# Patient Record
Sex: Male | Born: 1945 | Race: White | Hispanic: No | Marital: Married | Smoking: Former smoker
Health system: Southern US, Community
[De-identification: ages and names within clinical notes are randomized; demographics above are authoritative.]

## PROBLEM LIST (undated history)

## (undated) DIAGNOSIS — Z923 Personal history of irradiation: Secondary | ICD-10-CM

---

## 2010-10-26 ENCOUNTER — Ambulatory Visit (HOSPITAL_COMMUNITY)
Admission: RE | Admit: 2010-10-26 | Discharge: 2010-10-26 | Disposition: A | Discharge status: Home / Self Care | Payer: Medicare Other | Source: Ambulatory Visit | Attending: Neurological Surgery | Admitting: Neurological Surgery

## 2010-10-26 ENCOUNTER — Other Ambulatory Visit (HOSPITAL_COMMUNITY): Payer: Self-pay | Admitting: Neurological Surgery

## 2010-10-26 ENCOUNTER — Encounter (HOSPITAL_COMMUNITY)
Admission: RE | Admit: 2010-10-26 | Discharge: 2010-10-26 | Disposition: A | Discharge status: Home / Self Care | Payer: Medicare Other | Source: Ambulatory Visit | Attending: Neurological Surgery | Admitting: Neurological Surgery

## 2010-10-26 DIAGNOSIS — Z01810 Encounter for preprocedural cardiovascular examination: Secondary | ICD-10-CM | POA: Insufficient documentation

## 2010-10-26 DIAGNOSIS — Z87891 Personal history of nicotine dependence: Secondary | ICD-10-CM | POA: Insufficient documentation

## 2010-10-26 DIAGNOSIS — Z01818 Encounter for other preprocedural examination: Secondary | ICD-10-CM | POA: Insufficient documentation

## 2010-10-26 DIAGNOSIS — M4802 Spinal stenosis, cervical region: Secondary | ICD-10-CM

## 2010-10-26 DIAGNOSIS — I10 Essential (primary) hypertension: Secondary | ICD-10-CM | POA: Insufficient documentation

## 2010-10-26 DIAGNOSIS — Z01812 Encounter for preprocedural laboratory examination: Secondary | ICD-10-CM | POA: Insufficient documentation

## 2010-10-26 LAB — DIFFERENTIAL
Eosinophils Absolute: 0.2 10*3/uL (ref 0.0–0.7)
Eosinophils Relative: 2 % (ref 0–5)
Lymphs Abs: 2.2 10*3/uL (ref 0.7–4.0)
Monocytes Absolute: 0.5 10*3/uL (ref 0.1–1.0)
Monocytes Relative: 7 % (ref 3–12)

## 2010-10-26 LAB — SURGICAL PCR SCREEN: MRSA, PCR: NEGATIVE

## 2010-10-26 LAB — CBC
HCT: 42.3 % (ref 39.0–52.0)
MCH: 32.3 pg (ref 26.0–34.0)
MCV: 91.2 fL (ref 78.0–100.0)
Platelets: 201 10*3/uL (ref 150–400)
RDW: 13.3 % (ref 11.5–15.5)

## 2010-10-26 LAB — BASIC METABOLIC PANEL
CO2: 28 mEq/L (ref 19–32)
Calcium: 9.9 mg/dL (ref 8.4–10.5)
GFR calc Af Amer: >60 mL/min (ref >60)
Potassium: 4.1 mEq/L (ref 3.5–5.1)
Sodium: 137 mEq/L (ref 135–145)

## 2010-10-29 ENCOUNTER — Inpatient Hospital Stay (HOSPITAL_COMMUNITY)
Admission: RE | Admit: 2010-10-29 | Discharge: 2010-10-30 | DRG: 473 | Disposition: A | Discharge status: Home / Self Care | Payer: Medicare Other | Source: Ambulatory Visit | Attending: Neurological Surgery | Admitting: Neurological Surgery

## 2010-10-29 ENCOUNTER — Inpatient Hospital Stay (HOSPITAL_COMMUNITY): Payer: Medicare Other

## 2010-10-29 DIAGNOSIS — Z87891 Personal history of nicotine dependence: Secondary | ICD-10-CM

## 2010-10-29 DIAGNOSIS — Z79899 Other long term (current) drug therapy: Secondary | ICD-10-CM

## 2010-10-29 DIAGNOSIS — I10 Essential (primary) hypertension: Secondary | ICD-10-CM | POA: Diagnosis present

## 2010-10-29 DIAGNOSIS — M4712 Other spondylosis with myelopathy, cervical region: Principal | ICD-10-CM | POA: Diagnosis present

## 2010-11-06 NOTE — Op Note (Signed)
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  PROCEDURE: 1. Decompressive anterior cervical diskectomy at C5-6. 2. Anterior cervical arthrodesis at C5-6 utilizing an 8-mm PEEK     interbody cage packed with local autograft and Actifuse putty. 3. Anterior cervical plating at C5-6 utilizing Biomet MaxAn plate.  SURGEON:  Tia Alert, MD  ASSISTANT:  Reinaldo Meeker, MD  ANESTHESIA:  General endotracheal.  COMPLICATIONS:  None apparent.  INDICATIONS FOR PROCEDURE:  Michael Knox is a 65 year old gentleman who presented with cervical spinal stenosis.  He was referred by local orthopedic surgeons in [facility].  He had early myelopathy, but no significant radicular pain.  He had an MRI which showed severe spinal stenosis at C5-6 with signal change in the spinal cord with leftward spur at C6-7.  My original plan was to perform a C5-6 and C6-7 ACDF with plating, however, he presents today with no evidence of radiculopathy and no evidence of cord compression at C6-7 with only neural foraminal stenosis on the left at C6-7 and decided that the risks of performing the ACD at C6-7 likely outweighed the central benefits and therefore only decided to decompress the level with severe stenosis and the signal change in the cord at C5-6.  The patient understood the risks, benefits, and expected outcome of the procedure and wished to  proceed.  DESCRIPTION OF PROCEDURE:  The patient was taken to the operating room. After induction of adequate generalized endotracheal anesthesia, he was placed in supine position on the operating room table.  His right anterior cervical region was prepped with DuraPrep and draped in the usual sterile fashion.  Local anesthesia 3 mL was injected and a transverse incision was made to the right of midline and carried down to the platysma which was elevated and then opened with Metzenbaum scissors and then dissected a plane medial to the sternocleidomastoid muscle and internal carotid artery and lateral to the trachea and esophagus to expose C5-6 and C6-7.  Intraoperative fluoroscopy confirmed my level. There were large anterior osteophytes at C5-6.  There was a flowing osteophyte over C6-7.  I was able to remove the anterior osteophytes with Leksell rongeur and placed our shadow line retractors after taking down the longus colli muscle bilaterally at C5-6.  I then incised the disk space and performed the initial diskectomy with pituitary rongeurs and curved curettes.  I then used the high-speed drill to drill the endplates.  I widened the disk space from 2-3 mm to about 8 mm in height, drilling in a rectangular fashion with a high-speed drill.  I saved  the drill shavings in a mucus trap for later arthrodesis.  I drilled down to the level of the posterior spurs in the posterior longitudinal ligament.  The operating microscope was brought into the field.  We opened up the posterior longitudinal ligament with the nerve hook and then removed it from undercutting the bodies of C5 and C6 angling the scope up and down to look up under the vertebral bodies as best we could to decompress the canal.  We marched from pedicle to pedicle along the superior endplate of C6 and then performed bilateral foraminotomies along the C6 nerve roots.  Once we were done, the dura was full and capacious all the way  across.  We could palpate it easily in a circumferential fashion with a nerve hook and we could see the cord pulsatile through the dura.  Therefore, we felt likely he had a good decompression at this level and we measured the interspace to be 8 mm and then used a corresponding PEEK interbody cage, packed it with local autograft and Actifuse putty, and tapped this into position at C5-6.  I then used a Biomet MaxAn plate and placed two 14-mm variable angle screws in the body of C5 and C6 and these were locked in the plate by locking mechanism within the plate.  We then irrigated it with saline solution containing bacitracin, dried all bleeding points and once meticulous hemostasis was achieved I closed the platysma with 3-0 Vicryl, closed the subcuticular and subcutaneous tissues with 3-0 Vicryl, and closed the skin with benzoin and Steri-Strips.  The drapes were removed.  Steri-Strips were applied.  The patient was awakened from general anesthesia and transferred to the recovery room in stable condition.  At the end of the procedure, all sponge, needle, and instrument counts were correct.     Tia Alert, MD     DSJ/MEDQ  D:  10/29/2010  T:  10/30/2010  Job:  [id number]  Electronically Signed by Marikay Alar MD on 11/06/2010 08:11:44 AM

## 2010-12-08 ENCOUNTER — Other Ambulatory Visit: Payer: Self-pay | Admitting: Neurological Surgery

## 2010-12-08 ENCOUNTER — Ambulatory Visit
Admission: RE | Admit: 2010-12-08 | Discharge: 2010-12-08 | Disposition: A | Discharge status: Home / Self Care | Payer: Medicare Other | Source: Ambulatory Visit | Attending: Neurological Surgery | Admitting: Neurological Surgery

## 2010-12-08 DIAGNOSIS — M4712 Other spondylosis with myelopathy, cervical region: Secondary | ICD-10-CM

## 2013-05-24 DIAGNOSIS — K635 Polyp of colon: Secondary | ICD-10-CM | POA: Insufficient documentation

## 2014-04-23 DIAGNOSIS — Z85528 Personal history of other malignant neoplasm of kidney: Secondary | ICD-10-CM | POA: Insufficient documentation

## 2014-04-23 DIAGNOSIS — E559 Vitamin D deficiency, unspecified: Secondary | ICD-10-CM | POA: Insufficient documentation

## 2014-04-23 DIAGNOSIS — E785 Hyperlipidemia, unspecified: Secondary | ICD-10-CM | POA: Insufficient documentation

## 2014-04-23 DIAGNOSIS — R361 Hematospermia: Secondary | ICD-10-CM | POA: Insufficient documentation

## 2014-04-23 DIAGNOSIS — I10 Essential (primary) hypertension: Secondary | ICD-10-CM | POA: Insufficient documentation

## 2014-04-23 DIAGNOSIS — R3911 Hesitancy of micturition: Secondary | ICD-10-CM | POA: Insufficient documentation

## 2014-04-23 DIAGNOSIS — R369 Urethral discharge, unspecified: Secondary | ICD-10-CM | POA: Insufficient documentation

## 2014-04-23 DIAGNOSIS — I739 Peripheral vascular disease, unspecified: Secondary | ICD-10-CM | POA: Insufficient documentation

## 2014-04-23 DIAGNOSIS — R0989 Other specified symptoms and signs involving the circulatory and respiratory systems: Secondary | ICD-10-CM | POA: Insufficient documentation

## 2014-04-23 DIAGNOSIS — R911 Solitary pulmonary nodule: Secondary | ICD-10-CM | POA: Insufficient documentation

## 2014-04-23 DIAGNOSIS — R351 Nocturia: Secondary | ICD-10-CM | POA: Insufficient documentation

## 2014-04-23 DIAGNOSIS — N184 Chronic kidney disease, stage 4 (severe): Secondary | ICD-10-CM | POA: Insufficient documentation

## 2014-04-23 DIAGNOSIS — R3129 Other microscopic hematuria: Secondary | ICD-10-CM | POA: Insufficient documentation

## 2014-04-23 DIAGNOSIS — R749 Abnormal serum enzyme level, unspecified: Secondary | ICD-10-CM | POA: Insufficient documentation

## 2015-09-18 DIAGNOSIS — I6522 Occlusion and stenosis of left carotid artery: Secondary | ICD-10-CM | POA: Insufficient documentation

## 2015-09-18 DIAGNOSIS — N1831 Chronic kidney disease, stage 3a: Secondary | ICD-10-CM | POA: Insufficient documentation

## 2015-09-19 DIAGNOSIS — R7309 Other abnormal glucose: Secondary | ICD-10-CM | POA: Insufficient documentation

## 2016-11-10 DIAGNOSIS — H25043 Posterior subcapsular polar age-related cataract, bilateral: Secondary | ICD-10-CM | POA: Insufficient documentation

## 2016-11-10 DIAGNOSIS — H2513 Age-related nuclear cataract, bilateral: Secondary | ICD-10-CM | POA: Insufficient documentation

## 2016-11-10 DIAGNOSIS — H5203 Hypermetropia, bilateral: Secondary | ICD-10-CM | POA: Insufficient documentation

## 2017-04-21 DIAGNOSIS — I739 Peripheral vascular disease, unspecified: Secondary | ICD-10-CM | POA: Insufficient documentation

## 2019-09-10 DIAGNOSIS — U071 COVID-19: Secondary | ICD-10-CM | POA: Insufficient documentation

## 2019-12-13 DIAGNOSIS — M7989 Other specified soft tissue disorders: Secondary | ICD-10-CM | POA: Insufficient documentation

## 2020-12-26 DIAGNOSIS — I714 Abdominal aortic aneurysm, without rupture, unspecified: Secondary | ICD-10-CM | POA: Insufficient documentation

## 2021-11-02 DIAGNOSIS — C649 Malignant neoplasm of unspecified kidney, except renal pelvis: Secondary | ICD-10-CM | POA: Insufficient documentation

## 2021-12-01 ENCOUNTER — Other Ambulatory Visit: Payer: Self-pay | Admitting: Urology

## 2021-12-01 DIAGNOSIS — C801 Malignant (primary) neoplasm, unspecified: Secondary | ICD-10-CM

## 2021-12-01 DIAGNOSIS — C641 Malignant neoplasm of right kidney, except renal pelvis: Secondary | ICD-10-CM

## 2021-12-02 ENCOUNTER — Ambulatory Visit
Admission: RE | Admit: 2021-12-02 | Discharge: 2021-12-02 | Disposition: A | Discharge status: Home / Self Care | Payer: Self-pay | Source: Ambulatory Visit | Attending: Radiation Oncology | Admitting: Radiation Oncology

## 2021-12-02 ENCOUNTER — Other Ambulatory Visit: Payer: Self-pay | Admitting: Radiation Oncology

## 2021-12-02 DIAGNOSIS — C649 Malignant neoplasm of unspecified kidney, except renal pelvis: Secondary | ICD-10-CM

## 2021-12-15 ENCOUNTER — Ambulatory Visit
Admission: RE | Admit: 2021-12-15 | Discharge: 2021-12-15 | Disposition: A | Discharge status: Home / Self Care | Payer: Medicare Other | Source: Ambulatory Visit | Attending: Urology | Admitting: Urology

## 2021-12-15 DIAGNOSIS — C801 Malignant (primary) neoplasm, unspecified: Secondary | ICD-10-CM

## 2021-12-15 DIAGNOSIS — C641 Malignant neoplasm of right kidney, except renal pelvis: Secondary | ICD-10-CM

## 2021-12-15 MED ORDER — GADOBENATE DIMEGLUMINE 529 MG/ML IV SOLN
18.0000 mL | Freq: Once | INTRAVENOUS | Status: AC | PRN
  Administered 2021-12-15: 18 mL via INTRAVENOUS

## 2021-12-15 NOTE — Progress Notes (Signed)
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 ?Biopsies  ? ?COMPARISON: Ultrasound 12/26/2020, CT 12/16/2014 and previous ?  ?FINDINGS: ?CT CHEST FINDINGS ?  ?Cardiovascular: Heart size normal. No pericardial effusion. Coronary calcifications. 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 ?  ?Mediastinum/Nodes: No mass or adenopathy. ?  ?Lungs/Pleura: No pleural effusion. No neumothorax. 5 mm noncalcified nodule, anterior right upper lobe (3:63), new since 12/16/2014. Stable small calcified subpleural granuloma in the lateral right upper lobe (3:48). Progressive subpleural interstitial thickening peripherally in the lower lobes and right middle lobe. ?  ?Musculoskeletal: Cervical fixation hardware partially visualized.  Anterior vertebral endplate spurring at multiple levels in the mid and lower thoracic spine. ?  ?CT ABDOMEN PELVIS FINDINGS ?  ?Hepatobiliary: No focal liver abnormality is seen. No gallstones, gallbladder wall thickening, or biliary dilatation. ?  ?Pancreas: Unremarkable. No pancreatic ductal dilatation or surrounding inflammatory changes. ?  ?Spleen: Normal in size without focal abnormality. ?  ?Adrenals/Urinary Tract: Adrenal glands unremarkable. No urolithiasis or hydronephrosis. Postop changes in the lower pole right kidney. 2 surgical clips and partially calcified 1 cm soft tissue nodule projecting inferior to the right kidney in the retroperitoneum. 1.1 cm hyperdense lesion laterally in the mid left kidney possibly hemorrhagic cysts but incompletely characterized. Urinary bladder incompletely distended. ?  ?Stomach/Bowel: The stomach is incompletely distended, unremarkable. Small bowel decompressed. The colon is nondilated, unremarkable. ?  ?Vascular/Lymphatic: Bilobed fusiform infrarenal abdominal aortic aneurysm. Proximal moiety 4.2 cm diameter, distal moiety 4.9 cm, tapering to 2.6 cm at the bifurcation. No evidence  of leak. Moderate scattered iliofemoral calcified plaque without aneurysm. Retroaortic left renal vein, an anatomic variant.  No abdominal or pelvic adenopathy. ?  ?Reproductive: Prostate enlargement with central coarse calcifications. ?  ?Other: 7.3 x 6 cm right infrarenal para-aortic mass, IVC displaced anteriorly by the lesion without any intervening fat plane. No ascites. No free air. Left pelvic phleboliths. ?  ?Musculoskeletal: Mild multilevel spondylitic changes in the lumbar spine. No mass or worrisome bone lesion. Mild bilateral hip DJD. ?  ?IMPRESSION: ?1. 7.3 cm right retroperitoneal mass abutting/involving infrarenal IVC. Considerations include metastatic renal cell carcinoma, ?leiomyosarcoma, adenopathy, or other soft tissue neoplasm. This looks approachable posteriorly for percutaneous biopsy if needed. ? ?2. 5 mm right solid pulmonary nodule within the upper lobe, new since 2016. ? ?3. 4.9 cm bilobed infrarenal abdominal aortic aneurysm. Recommend follow-up CT/MR every 6 months and vascular consultation.  ? ?4. 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  Recommend annual imaging followup by CTA or MRA.  ? ?Tobacco/Marijuana/Snuff/ETOH use:  ? ?Past/Anticipated interventions by cardiothoracic surgery, if any:  Right flank open partial nephrectomy by Dr. Estill Dooms and remained diseased free until the fall of 2022. ? ?Past/Anticipated interventions by medical oncology, if any: NA ? ?Signs/Symptoms ?Weight changes, if any: No ?Respiratory complaints, if any:  No ?Hemoptysis, if any: No ?Pain issues, if any:  0/10 ? ?SAFETY ISSUES: ?Prior radiation?  No ?Pacemaker/ICD?  No ?Possible current pregnancy? No ?Is the patient on methotrexate? No ? ?Current Complaints / other details:    ?

## 2021-12-18 ENCOUNTER — Ambulatory Visit
Admission: RE | Admit: 2021-12-18 | Discharge: 2021-12-18 | Disposition: A | Discharge status: Home / Self Care | Payer: Medicare Other | Source: Ambulatory Visit | Attending: Radiation Oncology | Admitting: Radiation Oncology

## 2021-12-18 ENCOUNTER — Other Ambulatory Visit: Payer: Self-pay

## 2021-12-18 VITALS — BP 128/66 | HR 68 | Temp 97.8°F | Resp 18 | Ht 67.0 in | Wt 190.8 lb

## 2021-12-18 DIAGNOSIS — C7989 Secondary malignant neoplasm of other specified sites: Secondary | ICD-10-CM | POA: Diagnosis not present

## 2021-12-18 DIAGNOSIS — Z79899 Other long term (current) drug therapy: Secondary | ICD-10-CM | POA: Diagnosis not present

## 2021-12-18 DIAGNOSIS — C641 Malignant neoplasm of right kidney, except renal pelvis: Secondary | ICD-10-CM | POA: Insufficient documentation

## 2021-12-18 DIAGNOSIS — C649 Malignant neoplasm of unspecified kidney, except renal pelvis: Secondary | ICD-10-CM

## 2021-12-18 DIAGNOSIS — C786 Secondary malignant neoplasm of retroperitoneum and peritoneum: Secondary | ICD-10-CM | POA: Insufficient documentation

## 2021-12-18 DIAGNOSIS — Z7982 Long term (current) use of aspirin: Secondary | ICD-10-CM | POA: Diagnosis not present

## 2021-12-18 DIAGNOSIS — I7143 Infrarenal abdominal aortic aneurysm, without rupture: Secondary | ICD-10-CM | POA: Diagnosis not present

## 2021-12-18 NOTE — Progress Notes (Signed)
?Radiation Oncology         (336) [phone] ?________________________________ ? ?Initial outpatient Consultation ? ?Name: Michael Knox MRN: [id number]  ?Date of Service: 12/18/2021 DOB: 28-Oct-1945 ? ?HU:DJSHF, Eulogio Ditch, Christin Fudge, MD  ? ?REFERRING PHYSICIAN: Raynelle Bring, MD ? ?DIAGNOSIS: 76 year old male with oligometastatic renal cell carcinoma presenting with a 7.3 cm retroperitoneal mass ? ?  ICD-10-CM   ?1. Renal cell carcinoma, unspecified laterality ([place])  C64.9   ?  ?2. Metastatic renal cell carcinoma to intra-abdominal site [facility])  C79.89   ? C64.9   ?  ? ? ?HISTORY OF PRESENT ILLNESS: Michael Knox is a 76 y.o. male seen at the request of Dr. Alinda Money. He was initially diagnosed with renal cell carcinoma in 2013. He underwent right partial nephrectomy on 03/06/12 by Dr. 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  He was followed in close surveillance with repeat CT imaging until 2016, without any evidence of disease recurrence or progression. ? ?He had a CT C/A/P on 08/18/21 for routine surveillance of his AAA, and was found to have a 7.3 cm retroperitoneal mass abutting/involving the infrarenal IVC and a 5 mm right upper lobe pulmonary nodule. He was referred to Dr. Adair Laundry and had a biopsy of the retroperitoneal mass on 10/15/21 with final pathology confirming grade 3, renal cell carcinoma, clear-cell type with abundant necrosis. ? ?He was referred to Dr. Marcy Panning who recommended obtaining a disease staging bone scan which was performed on 10/28/21 and showed no definite evidence of osseous metastatic disease. Staging brain MRI was performed on 11/03/21 and was also negative for metastatic disease. ? ?He self-referred to Dr. Alinda Money for second opinion regarding treatment options on 11/27/2021.  Dr. Alinda Money recommended a repeat chest CT for further evaluation of the RUL pulmonary nodule as well as an MRI abdomen for further assessment  of the proximity of the retroperitoneal lesion to the IVC to determine whether he is a surgical candidate or would be better served with radiotherapy.  He has kindly been referred today to discuss the potential radiation treatment options.. ? ?PREVIOUS RADIATION THERAPY: No ? ?PAST MEDICAL HISTORY: History reviewed. No pertinent past medical history.   ? ?PAST SURGICAL HISTORY:History reviewed. No pertinent surgical history. ? ?FAMILY HISTORY: History reviewed. No pertinent family history. ? ?SOCIAL HISTORY:  ?Social History  ? ?Socioeconomic History  ? Marital status: Married  ?  Spouse name: Not on file  ? Number of children: Not on file  ? Years of education: Not on file  ? Highest education level: Not on file  ?Occupational History  ? Not on file  ?Tobacco Use  ? Smoking status: Not on file  ? Smokeless tobacco: Not on file  ?Substance and Sexual Activity  ? Alcohol use: Not on file  ? Drug use: Not on file  ? Sexual activity: Not on file  ?Other Topics Concern  ? Not on file  ?Social History Narrative  ? Not on file  ? ?Social Determinants of Health  ? ?Financial Resource Strain: Not on file  ?Food Insecurity: Not on file  ?Transportation Needs: Not on file  ?Physical Activity: Not on file  ?Stress: Not on file  ?Social Connections: Not on file  ?Intimate Partner Violence: Not on file  ? ? ?ALLERGIES: Atorvastatin, Pitavastatin, and Simvastatin ? ?MEDICATIONS:  ?Current Outpatient Medications  ?Medication Sig Dispense Refill  ? Cholecalciferol 25 MCG (1000 UT) capsule Take by  mouth.    ? rosuvastatin (CRESTOR) 20 MG tablet TAKE 1 TABLET BY MOUTH EVERY DAY FOR CHOLESTEROL    ? telmisartan (MICARDIS) 80 MG tablet TAKE ONE TABLET BY MOUTH EACH DAY FOR BLOOD PRESSURE    ? amLODipine (NORVASC) 10 MG tablet Take 10 mg by mouth daily.    ? aspirin 81 MG EC tablet Take 1 tablet by mouth daily.    ? famotidine (PEPCID) 20 MG tablet Take by mouth.    ? Garlic 8921 MG CAPS Take by mouth.    ? Omega-3 1000 MG CAPS Take 1  capsule by mouth daily.    ? ?No current facility-administered medications for this encounter.  ? ? ?REVIEW OF SYSTEMS:  On review of systems, the patient reports that he is doing well overall and remains completely asymptomatic.  He denies any chest pain, shortness of breath, cough, fevers, chills, night sweats, unintended weight changes.  He denies any bowel or bladder disturbances, and denies abdominal pain, nausea or vomiting.  He denies any new musculoskeletal or joint aches or pains. A complete review of systems is obtained and is otherwise negative. ? ?  ?PHYSICAL EXAM:  ?Wt Readings from Last 3 Encounters:  ?12/18/21 190 lb 12.8 oz (86.5 kg)  ? ?Temp Readings from Last 3 Encounters:  ?12/18/21 97.8 ?F (36.6 ?C) (Oral)  ? ?BP Readings from Last 3 Encounters:  ?12/18/21 128/66  ? ?Pulse Readings from Last 3 Encounters:  ?12/18/21 68  ? ?Pain Assessment ?Pain Score: 0-No pain/10 ? ?In general this is a well appearing Caucasian male in no acute distress.  He's alert and oriented x4 and appropriate throughout the examination. Cardiopulmonary assessment is negative for acute distress and he exhibits normal effort.  ? ? ?KPS = 100 ? ?100 - Normal; no complaints; no evidence of disease. ?90   - Able to carry on normal activity; minor signs or symptoms of disease. ?80   - Normal activity with effort; some signs or symptoms of disease. ?16   - Cares for self; unable to carry on normal activity or to do active work. ?60   - Requires occasional assistance, but is able to care for most of his personal needs. ?50   - Requires considerable assistance and frequent medical care. ?12   - Disabled; requires special care and assistance. ?30   - Severely disabled; hospital admission is indicated although death not imminent. ?20   - Very sick; hospital admission necessary; active supportive treatment necessary. ?10   - Moribund; fatal processes progressing rapidly. ?0     - Dead ? ?Karnofsky DA, Abelmann WH, Craver LS and  Burchenal [facility] [phone]) The use of the nitrogen mustards in the palliative treatment of carcinoma: with particular reference to bronchogenic carcinoma Cancer 1 634-56 ? ?LABORATORY DATA:  ?Lab Results  ?Component Value Date  ? WBC 6.7 10/26/2010  ? HGB 15.0 10/26/2010  ? HCT 42.3 10/26/2010  ? MCV 91.2 10/26/2010  ? PLT 201 10/26/2010  ? ?Lab Results  ?Component Value Date  ? NA 137 10/26/2010  ? K 4.1 10/26/2010  ? CL 103 10/26/2010  ? CO2 28 10/26/2010  ? ?No results found for: ALT, AST, GGT, ALKPHOS, BILITOT ?  ?RADIOGRAPHY: MR ABDOMEN WWO CONTRAST ? ?Result Date: 12/16/2021 ?CLINICAL DATA:  Follow-up metastatic renal cell carcinoma. Previous partial right nephrectomy. EXAM: MRI ABDOMEN WITHOUT AND WITH CONTRAST TECHNIQUE: Multiplanar multisequence MR imaging of the abdomen was performed both before and after the administration of intravenous contrast. CONTRAST:  69m MULTIHANCE GADOBENATE DIMEGLUMINE 529 MG/ML IV SOLN COMPARISON:  [facility] FINDINGS: Lower chest: No acute findings. Hepatobiliary: No hepatic masses identified. Gallbladder is unremarkable. No evidence of biliary ductal dilatation. Pancreas:  No mass or inflammatory changes. Spleen:  Within normal limits in size and appearance. Adrenals/Urinary Tract: Postop changes again seen from partial right nephrectomy. Multiple tiny benign-appearing renal cysts are again seen bilaterally (no followup imaging is recommended). No evidence of renal mass or hydronephrosis. Stomach/Bowel: Unremarkable. Vascular/Lymphatic: A large heterogeneously enhancing retroperitoneal mass is again seen in the retrocaval region. This measures 7.3 by 5.7 x 4.4 cm, without significant change since prior study. This is consistent with metastatic lymphadenopathy. No other pathologically enlarged lymph nodes are identified. 4.7 cm infrarenal abdominal aortic aneurysm shows no significant change. Other:  None. Musculoskeletal:  No  suspicious bone lesions identified. IMPRESSION: Stable right retroperitoneal mass, consistent with metastatic lymphadenopathy. No new or progressive disease within the abdomen. Stable 4.7 cm infrarenal abdominal

## 2021-12-20 ENCOUNTER — Encounter: Payer: Self-pay | Admitting: Radiation Oncology

## 2021-12-21 NOTE — Progress Notes (Signed)
Patient will be added on to conference for 4/25 to review imaging with radiology and consulting MD's. ?

## 2022-01-04 ENCOUNTER — Other Ambulatory Visit: Payer: Self-pay

## 2022-01-04 ENCOUNTER — Ambulatory Visit
Admission: RE | Admit: 2022-01-04 | Discharge: 2022-01-04 | Disposition: A | Discharge status: Home / Self Care | Payer: Medicare Other | Source: Ambulatory Visit | Attending: Radiation Oncology | Admitting: Radiation Oncology

## 2022-01-04 DIAGNOSIS — C649 Malignant neoplasm of unspecified kidney, except renal pelvis: Secondary | ICD-10-CM | POA: Diagnosis present

## 2022-01-04 DIAGNOSIS — Z510 Encounter for antineoplastic radiation therapy: Secondary | ICD-10-CM | POA: Diagnosis not present

## 2022-01-04 DIAGNOSIS — C7989 Secondary malignant neoplasm of other specified sites: Secondary | ICD-10-CM | POA: Insufficient documentation

## 2022-01-04 NOTE — Progress Notes (Signed)
?  Radiation Oncology         (336) [phone] ?________________________________ ? ?Name: DEVONN GIAMPIETRO MRN: [id number]  ?Date: 01/04/2022  DOB: Sep 19, 1945 ? ? ?Ultra-Hypofractionated Radiation Therapy [place] [facility]) ? ?SIMULATION AND TREATMENT PLANNING NOTE ? ?  ICD-10-CM   ?1. 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 C64.9   ?  ? ? ?DIAGNOSIS:  76 year old male with oligometastatic renal cell carcinoma presenting with a 7.3 cm retroperitoneal mass ? ?NARRATIVE:  The patient was brought to the [place].  Identity was confirmed.  All relevant records and images related to the planned course of therapy were reviewed.  The patient freely provided informed written consent to proceed with treatment after reviewing the details related to the planned course of therapy. The consent form was witnessed and verified by the simulation staff.  Then, the patient was set-up in a stable reproducible  supine position for radiation therapy.  A BodyFix immobilization pillow was fabricated for reproducible positioning.  Surface markings were placed.  The CT images were loaded into the planning software.  The gross target volumes (GTV) and planning target volumes (PTV) were delinieated, and avoidance structures were contoured.  Treatment planning then occurred.  The radiation prescription was entered and confirmed.  A total of two complex treatment devices were fabricated in the form of the BodyFix immobilization pillow and a neck accuform cushion.  I have requested : 3D Simulation  I have requested a DVH of the following structures: targets and all normal structures near the target including kidneys, liver, bowel, stomach, spinal cord and others as noted on the radiation plan to maintain doses in adherence with established limits ? ?SPECIAL TREATMENT PROCEDURE:  The planned course of therapy using radiation constitutes a special treatment procedure. Special care is required in the management of  this patient for the following reasons. High dose per fraction requiring special monitoring for increased toxicities of treatment including daily imaging..  The special nature of the planned course of radiotherapy will require increased physician supervision and oversight to ensure patient's safety with optimal treatment outcomes.    This requires extended time and effort.   ? ?PLAN:  The patient will receive 50 Gy in 10 fraction. ? ?________________________________ ? ?Sheral Apley Tammi Klippel, M.D. ? ?

## 2022-01-15 DIAGNOSIS — Z510 Encounter for antineoplastic radiation therapy: Secondary | ICD-10-CM | POA: Diagnosis not present

## 2022-01-18 ENCOUNTER — Ambulatory Visit
Admission: RE | Admit: 2022-01-18 | Discharge: 2022-01-18 | Disposition: A | Discharge status: Home / Self Care | Payer: Medicare Other | Source: Ambulatory Visit | Attending: Radiation Oncology | Admitting: Radiation Oncology

## 2022-01-18 ENCOUNTER — Other Ambulatory Visit: Payer: Self-pay

## 2022-01-18 DIAGNOSIS — Z510 Encounter for antineoplastic radiation therapy: Secondary | ICD-10-CM | POA: Diagnosis not present

## 2022-01-18 LAB — RAD ONC ARIA SESSION SUMMARY
Course Elapsed Days: 0
Plan Fractions Treated to Date: 1
Plan Prescribed Dose Per Fraction: 5 Gy
Plan Total Fractions Prescribed: 10
Plan Total Prescribed Dose: 50 Gy
Reference Point Dosage Given to Date: 5 Gy
Reference Point Session Dosage Given: 5 Gy
Session Number: 1

## 2022-01-19 ENCOUNTER — Ambulatory Visit
Admission: RE | Admit: 2022-01-19 | Discharge: 2022-01-19 | Disposition: A | Discharge status: Home / Self Care | Payer: Medicare Other | Source: Ambulatory Visit | Attending: Radiation Oncology | Admitting: Radiation Oncology

## 2022-01-19 ENCOUNTER — Other Ambulatory Visit: Payer: Self-pay

## 2022-01-19 DIAGNOSIS — Z510 Encounter for antineoplastic radiation therapy: Secondary | ICD-10-CM | POA: Diagnosis not present

## 2022-01-19 LAB — RAD ONC ARIA SESSION SUMMARY
Course Elapsed Days: 1
Plan Fractions Treated to Date: 2
Plan Prescribed Dose Per Fraction: 5 Gy
Plan Total Fractions Prescribed: 10
Plan Total Prescribed Dose: 50 Gy
Reference Point Dosage Given to Date: 10 Gy
Reference Point Session Dosage Given: 5 Gy
Session Number: 2

## 2022-01-20 ENCOUNTER — Other Ambulatory Visit: Payer: Self-pay

## 2022-01-20 ENCOUNTER — Ambulatory Visit
Admission: RE | Admit: 2022-01-20 | Discharge: 2022-01-20 | Disposition: A | Discharge status: Home / Self Care | Payer: Medicare Other | Source: Ambulatory Visit | Attending: Radiation Oncology | Admitting: Radiation Oncology

## 2022-01-20 DIAGNOSIS — Z510 Encounter for antineoplastic radiation therapy: Secondary | ICD-10-CM | POA: Diagnosis not present

## 2022-01-20 LAB — RAD ONC ARIA SESSION SUMMARY
Course Elapsed Days: 2
Plan Fractions Treated to Date: 3
Plan Prescribed Dose Per Fraction: 5 Gy
Plan Total Fractions Prescribed: 10
Plan Total Prescribed Dose: 50 Gy
Reference Point Dosage Given to Date: 15 Gy
Reference Point Session Dosage Given: 5 Gy
Session Number: 3

## 2022-01-21 ENCOUNTER — Ambulatory Visit
Admission: RE | Admit: 2022-01-21 | Discharge: 2022-01-21 | Disposition: A | Discharge status: Home / Self Care | Payer: Medicare Other | Source: Ambulatory Visit | Attending: Radiation Oncology | Admitting: Radiation Oncology

## 2022-01-21 ENCOUNTER — Other Ambulatory Visit: Payer: Self-pay

## 2022-01-21 DIAGNOSIS — Z510 Encounter for antineoplastic radiation therapy: Secondary | ICD-10-CM | POA: Diagnosis not present

## 2022-01-21 LAB — RAD ONC ARIA SESSION SUMMARY
Course Elapsed Days: 3
Plan Fractions Treated to Date: 4
Plan Prescribed Dose Per Fraction: 5 Gy
Plan Total Fractions Prescribed: 10
Plan Total Prescribed Dose: 50 Gy
Reference Point Dosage Given to Date: 20 Gy
Reference Point Session Dosage Given: 5 Gy
Session Number: 4

## 2022-01-22 ENCOUNTER — Other Ambulatory Visit: Payer: Self-pay

## 2022-01-22 ENCOUNTER — Ambulatory Visit
Admission: RE | Admit: 2022-01-22 | Discharge: 2022-01-22 | Disposition: A | Discharge status: Home / Self Care | Payer: Medicare Other | Source: Ambulatory Visit | Attending: Radiation Oncology | Admitting: Radiation Oncology

## 2022-01-22 DIAGNOSIS — Z510 Encounter for antineoplastic radiation therapy: Secondary | ICD-10-CM | POA: Diagnosis not present

## 2022-01-22 LAB — RAD ONC ARIA SESSION SUMMARY
Course Elapsed Days: 4
Plan Fractions Treated to Date: 5
Plan Prescribed Dose Per Fraction: 5 Gy
Plan Total Fractions Prescribed: 10
Plan Total Prescribed Dose: 50 Gy
Reference Point Dosage Given to Date: 25 Gy
Reference Point Session Dosage Given: 5 Gy
Session Number: 5

## 2022-01-26 ENCOUNTER — Ambulatory Visit
Admission: RE | Admit: 2022-01-26 | Discharge: 2022-01-26 | Disposition: A | Discharge status: Home / Self Care | Payer: Medicare Other | Source: Ambulatory Visit | Attending: Radiation Oncology | Admitting: Radiation Oncology

## 2022-01-26 ENCOUNTER — Other Ambulatory Visit: Payer: Self-pay

## 2022-01-26 DIAGNOSIS — Z510 Encounter for antineoplastic radiation therapy: Secondary | ICD-10-CM | POA: Diagnosis not present

## 2022-01-26 LAB — RAD ONC ARIA SESSION SUMMARY
Course Elapsed Days: 8
Plan Fractions Treated to Date: 6
Plan Prescribed Dose Per Fraction: 5 Gy
Plan Total Fractions Prescribed: 10
Plan Total Prescribed Dose: 50 Gy
Reference Point Dosage Given to Date: 30 Gy
Reference Point Session Dosage Given: 5 Gy
Session Number: 6

## 2022-01-27 ENCOUNTER — Other Ambulatory Visit: Payer: Self-pay

## 2022-01-27 ENCOUNTER — Ambulatory Visit
Admission: RE | Admit: 2022-01-27 | Discharge: 2022-01-27 | Disposition: A | Discharge status: Home / Self Care | Payer: Medicare Other | Source: Ambulatory Visit | Attending: Radiation Oncology | Admitting: Radiation Oncology

## 2022-01-27 DIAGNOSIS — Z510 Encounter for antineoplastic radiation therapy: Secondary | ICD-10-CM | POA: Diagnosis not present

## 2022-01-27 LAB — RAD ONC ARIA SESSION SUMMARY
Course Elapsed Days: 9
Plan Fractions Treated to Date: 7
Plan Prescribed Dose Per Fraction: 5 Gy
Plan Total Fractions Prescribed: 10
Plan Total Prescribed Dose: 50 Gy
Reference Point Dosage Given to Date: 35 Gy
Reference Point Session Dosage Given: 5 Gy
Session Number: 7

## 2022-01-28 ENCOUNTER — Other Ambulatory Visit: Payer: Self-pay

## 2022-01-28 ENCOUNTER — Ambulatory Visit
Admission: RE | Admit: 2022-01-28 | Discharge: 2022-01-28 | Disposition: A | Discharge status: Home / Self Care | Payer: Medicare Other | Source: Ambulatory Visit | Attending: Radiation Oncology | Admitting: Radiation Oncology

## 2022-01-28 DIAGNOSIS — C786 Secondary malignant neoplasm of retroperitoneum and peritoneum: Secondary | ICD-10-CM | POA: Insufficient documentation

## 2022-01-28 DIAGNOSIS — C649 Malignant neoplasm of unspecified kidney, except renal pelvis: Secondary | ICD-10-CM | POA: Insufficient documentation

## 2022-01-28 DIAGNOSIS — Z510 Encounter for antineoplastic radiation therapy: Secondary | ICD-10-CM | POA: Insufficient documentation

## 2022-01-28 LAB — RAD ONC ARIA SESSION SUMMARY
Course Elapsed Days: 10
Plan Fractions Treated to Date: 8
Plan Prescribed Dose Per Fraction: 5 Gy
Plan Total Fractions Prescribed: 10
Plan Total Prescribed Dose: 50 Gy
Reference Point Dosage Given to Date: 40 Gy
Reference Point Session Dosage Given: 5 Gy
Session Number: 8

## 2022-01-29 ENCOUNTER — Other Ambulatory Visit: Payer: Self-pay | Admitting: Radiation Oncology

## 2022-01-29 ENCOUNTER — Other Ambulatory Visit: Payer: Self-pay

## 2022-01-29 ENCOUNTER — Ambulatory Visit
Admission: RE | Admit: 2022-01-29 | Discharge: 2022-01-29 | Disposition: A | Discharge status: Home / Self Care | Payer: Medicare Other | Source: Ambulatory Visit | Attending: Radiation Oncology | Admitting: Radiation Oncology

## 2022-01-29 DIAGNOSIS — Z510 Encounter for antineoplastic radiation therapy: Secondary | ICD-10-CM | POA: Diagnosis not present

## 2022-01-29 LAB — RAD ONC ARIA SESSION SUMMARY
Course Elapsed Days: 11
Plan Fractions Treated to Date: 9
Plan Prescribed Dose Per Fraction: 5 Gy
Plan Total Fractions Prescribed: 10
Plan Total Prescribed Dose: 50 Gy
Reference Point Dosage Given to Date: 45 Gy
Reference Point Session Dosage Given: 5 Gy
Session Number: 9

## 2022-01-29 MED ORDER — PROMETHAZINE HCL 25 MG PO TABS: 25.0000 mg | ORAL_TABLET | Freq: Four times a day (QID) | ORAL | 0 refills | Status: AC | PRN

## 2022-02-01 ENCOUNTER — Other Ambulatory Visit: Payer: Self-pay

## 2022-02-01 ENCOUNTER — Encounter: Payer: Self-pay | Admitting: Urology

## 2022-02-01 ENCOUNTER — Ambulatory Visit
Admission: RE | Admit: 2022-02-01 | Discharge: 2022-02-01 | Disposition: A | Discharge status: Home / Self Care | Payer: Medicare Other | Source: Ambulatory Visit | Attending: Radiation Oncology | Admitting: Radiation Oncology

## 2022-02-01 DIAGNOSIS — Z510 Encounter for antineoplastic radiation therapy: Secondary | ICD-10-CM | POA: Diagnosis not present

## 2022-02-01 DIAGNOSIS — C649 Malignant neoplasm of unspecified kidney, except renal pelvis: Secondary | ICD-10-CM

## 2022-02-01 LAB — RAD ONC ARIA SESSION SUMMARY
Course Elapsed Days: 14
Plan Fractions Treated to Date: 10
Plan Prescribed Dose Per Fraction: 5 Gy
Plan Total Fractions Prescribed: 10
Plan Total Prescribed Dose: 50 Gy
Reference Point Dosage Given to Date: 50 Gy
Reference Point Session Dosage Given: 5 Gy
Session Number: 10

## 2022-03-17 ENCOUNTER — Encounter: Payer: Self-pay | Admitting: Urology

## 2022-03-17 NOTE — Progress Notes (Signed)
Telephone appointment. I verified patient's identity and began nursing interview. Patient reports LT sided-central, upper rib pain 3/10. No other renal related issues reported at this time.  Meaningful use complete. No urinary management medications. I-PSS score of 4-mild. Urology appt- None  Reminded patient of his 10:30am-03/18/22 telephone appointment w/ Ashlyn Bruning PA-C. I left my extension [phone] in case patient needs anything. Patient verbalized understanding.  Patient contact [phone]

## 2022-03-18 ENCOUNTER — Ambulatory Visit
Admission: RE | Admit: 2022-03-18 | Discharge: 2022-03-18 | Disposition: A | Discharge status: Home / Self Care | Payer: Medicare Other | Source: Ambulatory Visit | Attending: Urology | Admitting: Urology

## 2022-03-18 DIAGNOSIS — C649 Malignant neoplasm of unspecified kidney, except renal pelvis: Secondary | ICD-10-CM

## 2022-03-18 NOTE — Progress Notes (Signed)
  Radiation Oncology         (336) [phone] ________________________________  Name: SHAWNMICHAEL PARENTEAU MRN: [id number]  Date: 02/01/2022  DOB: 22-Feb-1946  End of Treatment Note  Diagnosis:   76 year old male with oligometastatic renal cell carcinoma presenting with a 7.3 cm retroperitoneal mass     Indication for treatment:  Curative, Definitive UHRT       Radiation treatment dates:   01/18/22 - 02/01/22  Site/dose:   The retroperitoneal mass was treated to 50 Gy in 10 fractions of 5 Gy  Beams/energy:   The patient was treated using stereotactic body radiotherapy according to a 3D conformal radiotherapy plan.  Volumetric arc fields were employed to deliver 6 MV X-rays.  Image guidance was performed with per fraction cone beam CT prior to treatment under personal MD supervision.  Immobilization was achieved using BodyFix Pillow.  Narrative: The patient tolerated radiation treatment relatively well with some mild nausea but no vomiting or diarrhea.  Plan: The patient has completed radiation treatment. The patient will return to radiation oncology clinic for routine followup in one month. I advised them to call or return sooner if they have any questions or concerns related to their recovery or treatment. ________________________________  Sheral Apley. Tammi Klippel, M.D.

## 2022-03-18 NOTE — Progress Notes (Signed)
Radiation Oncology         [phone]) [phone] ________________________________  Name: Michael Knox MRN: [id number]  Date: 03/18/2022  DOB: June 04, 1946  Post Treatment Note  CC: Waylan Boga, MD  Diagnosis:   76 year old male with oligometastatic renal cell carcinoma presenting with a 7.3 cm retroperitoneal mass     Interval Since Last Radiation:  6.5 weeks  01/18/22 - 02/01/22:  The retroperitoneal mass was treated to 50 Gy in 10 fractions of 5 Gy  Narrative:  I spoke with the patient to conduct his routine scheduled 1 month follow up visit via telephone to spare the patient unnecessary potential exposure in the healthcare setting during the current COVID-19 pandemic.  The patient was notified in advance and gave permission to proceed with this visit format.  He tolerated radiation treatment relatively well with some mild nausea but no vomiting or diarrhea.                              On review of systems, the patient states that he is doing well in general.  He did have a few days of significant nausea, vomiting and diarrhea in the week after completing his treatments but this resolved spontaneously and has not recurred.  He is currently without complaints aside from some mild persistent fatigue but specifically denies abdominal pain, nausea, vomiting, diarrhea or constipation.  Overall, he is pleased with his progress to date and has an upcoming follow-up appointment with his medical oncologist, Dr. Humphrey Rolls on 03/23/2022.  ALLERGIES:  is allergic to atorvastatin, pitavastatin, and simvastatin.  Meds: Current Outpatient Medications  Medication Sig Dispense Refill   amLODipine (NORVASC) 10 MG tablet Take 10 mg by mouth daily.     aspirin 81 MG EC tablet Take 1 tablet by mouth daily.     Cholecalciferol 25 MCG (1000 UT) capsule Take by mouth.     famotidine (PEPCID) 20 MG tablet Take by mouth.     Garlic 1324 MG CAPS Take by mouth.     Omega-3 1000 MG CAPS Take 1 capsule  by mouth daily.     promethazine (PHENERGAN) 25 MG tablet Take 1 tablet (25 mg total) by mouth every 6 (six) hours as needed for nausea or vomiting. 30 tablet 0   rosuvastatin (CRESTOR) 20 MG tablet TAKE 1 TABLET BY MOUTH EVERY DAY FOR CHOLESTEROL     telmisartan (MICARDIS) 80 MG tablet TAKE ONE TABLET BY MOUTH EACH DAY FOR BLOOD PRESSURE     No current facility-administered medications for this encounter.    Physical Findings:  vitals were not taken for this visit.  Pain Assessment Pain Score: 3  (LT upper/ central rib pain.)/10 Unable to assess due to telephone follow-up visit format.  Lab Findings: Lab Results  Component Value Date   WBC 6.7 10/26/2010   HGB 15.0 10/26/2010   HCT 42.3 10/26/2010   MCV 91.2 10/26/2010   PLT 201 10/26/2010     Radiographic Findings: No results found.  Impression/Plan: 74. 76 year old male with oligometastatic renal cell carcinoma presenting with a 7.3 cm retroperitoneal mass. He appears to have recovered well from the effects of his recent ultrahypofractionated radiotherapy and is currently without complaints.  We discussed that while we are happy to continue to participate in his care if clinically indicated, at this point, we would plan to see him back on an as-needed basis.  He will continue in routine follow-up  under the care and direction of Dr. Humphrey Rolls and would also like to continue his care with Dr. Alinda Money.  He has a scheduled follow-up appointment with Dr. Humphrey Rolls on 03/23/2022.  He does not currently have any scheduled follow-up with Dr. Alinda Money but would like to get his opinion regarding monitoring his response to treatment and any recommendations for further treatments before he would consider any additional treatment.  I will share this discussion with Dr. Humphrey Rolls and Dr. Alinda Money and look forward to continuing to follow his progress via correspondence.    Nicholos Johns, PA-C

## 2022-04-29 ENCOUNTER — Telehealth: Payer: Self-pay | Admitting: Oncology

## 2022-04-29 NOTE — Telephone Encounter (Signed)
Scheduled appt per 8/30 referral. Pt is aware of appt date and time. Pt is aware to arrive 15 mins prior to appt time and to bring and updated insurance card. Pt is aware of appt location.   

## 2022-05-12 ENCOUNTER — Other Ambulatory Visit: Payer: Self-pay

## 2022-05-12 ENCOUNTER — Inpatient Hospital Stay: Payer: Medicare Other | Attending: Oncology | Admitting: Oncology

## 2022-05-12 VITALS — BP 154/61 | HR 60 | Temp 98.1°F | Resp 17 | Ht 67.0 in | Wt 187.4 lb

## 2022-05-12 DIAGNOSIS — C786 Secondary malignant neoplasm of retroperitoneum and peritoneum: Secondary | ICD-10-CM | POA: Diagnosis not present

## 2022-05-12 DIAGNOSIS — C649 Malignant neoplasm of unspecified kidney, except renal pelvis: Secondary | ICD-10-CM

## 2022-05-12 DIAGNOSIS — Z87891 Personal history of nicotine dependence: Secondary | ICD-10-CM | POA: Diagnosis not present

## 2022-05-12 DIAGNOSIS — C641 Malignant neoplasm of right kidney, except renal pelvis: Secondary | ICD-10-CM

## 2022-05-12 DIAGNOSIS — Z905 Acquired absence of kidney: Secondary | ICD-10-CM

## 2022-05-12 NOTE — Progress Notes (Signed)
Reason for the request:    Kidney cancer  HPI: I was asked by Tami Ribas, PA-C   to evaluate Michael Knox for the evaluation of kidney cancer.  He is a 76 year old man with history of hypertension and hyperlipidemia was diagnosed with kidney cancer in 2013.  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  He remained on active surveillance without any evidence of relapsed disease. CT scan obtained on August 18, 2021 showed a 7.3 cm retroperitoneal mass involving the IVC and a 5 mm right upper lobe nodule.  Biopsy obtained in February 2023 showed recurrent disease including clear-cell renal cell carcinoma.  He was evaluated subsequently by Dr. Tammi Klippel and underwent radiation therapy for a total of 10 fractions for a total of 50 Gray completed in June 2023.  He has subsequently was on active surveillance and imaging studies, noted on March 25, 2022 with a PET scan.  The PET scan showed a right retroperitoneal nodal recurrence of renal carcinoma that has not significantly changed in size from recent prior study demonstrated low-level activity.  No evidence of metastatic disease otherwise.  She is followed by Dr. Humphrey Rolls and recommended that systemic therapy for him which the patient has declined.  Clinically, he reports no major changes in his health.  He denies any nausea, vomiting or abdominal pain.  He denies any pelvic pain or discomfort.  He denies any hematuria or dysuria.  She does not report any headaches, blurry vision, syncope or seizures. Does not report any fevers, chills or sweats.  Does not report any cough, wheezing or hemoptysis.  Does not report any chest pain, palpitation, orthopnea or leg edema.  Does not report any nausea, vomiting or abdominal pain.  Does not report any constipation or diarrhea.  Does not report any skeletal complaints.    Does not report frequency, urgency or hematuria.  Does not report any skin rashes or lesions. Does not  report any heat or cold intolerance.  Does not report any lymphadenopathy or petechiae.  Does not report any anxiety or depression.  Remaining review of systems is negative.   Past medical history significant for hypertension and hyperlipidemia.  Current Outpatient Medications:    amLODipine (NORVASC) 10 MG tablet, Take 10 mg by mouth daily., Disp: , Rfl:    aspirin 81 MG EC tablet, Take 1 tablet by mouth daily., Disp: , Rfl:    Cholecalciferol 25 MCG (1000 UT) capsule, Take by mouth., Disp: , Rfl:    famotidine (PEPCID) 20 MG tablet, Take by mouth., Disp: , Rfl:    Garlic 4818 MG CAPS, Take by mouth., Disp: , Rfl:    Omega-3 1000 MG CAPS, Take 1 capsule by mouth daily., Disp: , Rfl:    promethazine (PHENERGAN) 25 MG tablet, Take 1 tablet (25 mg total) by mouth every 6 (six) hours as needed for nausea or vomiting., Disp: 30 tablet, Rfl: 0   rosuvastatin (CRESTOR) 20 MG tablet, TAKE 1 TABLET BY MOUTH EVERY DAY FOR CHOLESTEROL, Disp: , Rfl:    telmisartan (MICARDIS) 80 MG tablet, TAKE ONE TABLET BY MOUTH EACH DAY FOR BLOOD PRESSURE, Disp: , Rfl: :   Allergies  Allergen Reactions   Atorvastatin Other (See Comments)    Joint pain Joint Pain    Pitavastatin Other (See Comments)    Headache, constipation Constipation, headache    Simvastatin     Other reaction(s): Other (See Comments) Joint pain Joint pain   :  No family history on file.:   Social History   Socioeconomic History   Marital status: Married    Spouse name: Not on file   Number of children: Not on file   Years of education: Not on file   Highest education level: Not on file  Occupational History   Not on file  Tobacco Use   Smoking status: Former    Types: Cigarettes   Smokeless tobacco: Former  Substance and Sexual Activity   Alcohol use: Not Currently   Drug use: Not on file   Sexual activity: Not on file  Other Topics Concern   Not on file  Social History Narrative   Not on file   Social  Determinants of Health   Financial Resource Strain: Not on file  Food Insecurity: Not on file  Transportation Needs: Not on file  Physical Activity: Not on file  Stress: Not on file  Social Connections: Not on file  Intimate Partner Violence: Not on file  :  Pertinent items are noted in HPI.  Exam: Blood pressure (!) 154/61, pulse 60, temperature 98.1 F (36.7 C), temperature source Temporal, resp. rate 17, height '5\' 7"'$  (1.702 m), weight 187 lb 6.4 oz (85 kg), SpO2 97 %. ECOG 1 General appearance: alert and cooperative appeared without distress. Head: atraumatic without any abnormalities. Eyes: conjunctivae/corneas clear. PERRL.  Sclera anicteric. Throat: lips, mucosa, and tongue normal; without oral thrush or ulcers. Resp: clear to auscultation bilaterally without rhonchi, wheezes or dullness to percussion. Cardio: regular rate and rhythm, S1, S2 normal, no murmur, click, rub or gallop GI: soft, non-tender; bowel sounds normal; no masses,  no organomegaly Skin: Skin color, texture, turgor normal. No rashes or lesions Lymph nodes: Cervical, supraclavicular, and axillary nodes normal. Neurologic: Grossly normal without any motor, sensory or deep tendon reflexes. Musculoskeletal: No joint deformity or effusion.    Assessment and Plan:    76 year old with:  1.  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  He is status post right partial nephrectomy in July 2013.  He developed recurrent disease in 2022 with retroperitoneal mass.  He received radiation therapy for his oligometastatic disease completed in June 2023.  The natural course of this disease was reviewed at this time and treatment choices were discussed.  PET scan obtained on March 25, 2022 was personally reviewed as discussed.  His tumor appears to have responded although still has residual disease that could regress or progress in the future.    Treatment options at this time including  continued active surveillance although he is at high risk of developing relapse versus systemic therapy.  Systemic therapy options including oral targeted therapy, immunotherapy or combination immunotherapy as well as oral targeted therapy.  Immunotherapy doublet with ipilimumab and nivolumab is a standard of care in addition to Pembrolizumab with axitinib or cabozantinib and nivolumab as well as lenvatinib and Pembrolizumab.  Adjuvant single agent Pembrolizumab has been approved for resected stage IV disease if he has no active disease based on subsequent imaging studies.  This could be extrapolated to his case if he continues to have progression in his retroperitoneal mass.  After discussion today, we have opted to repeat his imaging studies in the next 2 to 3 months and will reassess at that time.  Adjuvant Pembrolizumab versus doublet immunotherapy versus continued active surveillance will be considered at that time.  2.  Follow-up: We will be in the next 3 months for repeat follow-up.  60  minutes  were dedicated to this visit. The time was spent on reviewing pathology results, imaging studies, discussing treatment options, and answering questions regarding future plan.     A copy of this consult has been forwarded to the requesting physician.

## 2022-07-06 ENCOUNTER — Encounter: Payer: Self-pay | Admitting: Oncology

## 2022-07-06 ENCOUNTER — Telehealth: Payer: Self-pay | Admitting: *Deleted

## 2022-07-06 NOTE — Telephone Encounter (Signed)
PC to patient, spoke with his wife Jackelyn Poling, informed her Dr Alen Blew wants the patient to have his PET scan as soon as possible.  As soon as we have PA for the scan, we will contact them to get this scheduled.  She verbalizes understanding.

## 2022-07-08 ENCOUNTER — Telehealth: Payer: Self-pay | Admitting: *Deleted

## 2022-07-08 NOTE — Telephone Encounter (Signed)
PC to patient, spoke with his wife Jackelyn Poling, informed her that his PET scan has been approved by insurance & may be scheduled with [employer] [phone].  Dr Alen Blew would like this scheduled within the next week.  She verbalizes understanding.

## 2022-07-20 ENCOUNTER — Telehealth: Payer: Self-pay | Admitting: Oncology

## 2022-07-20 NOTE — Telephone Encounter (Signed)
Called patient regarding December appointment, patient has been called and notified. 

## 2022-07-21 ENCOUNTER — Ambulatory Visit (HOSPITAL_COMMUNITY)
Admission: RE | Admit: 2022-07-21 | Discharge: 2022-07-21 | Disposition: A | Discharge status: Home / Self Care | Payer: Medicare Other | Source: Ambulatory Visit | Attending: Oncology | Admitting: Oncology

## 2022-07-21 DIAGNOSIS — C649 Malignant neoplasm of unspecified kidney, except renal pelvis: Secondary | ICD-10-CM

## 2022-07-21 LAB — GLUCOSE, CAPILLARY: Glucose-Capillary: 144 mg/dL — ABNORMAL HIGH (ref 70–99)

## 2022-07-21 MED ORDER — FLUDEOXYGLUCOSE F - 18 (FDG) INJECTION
9.3000 | Freq: Once | INTRAVENOUS | Status: AC | PRN
  Administered 2022-07-21: 9.2 via INTRAVENOUS

## 2022-07-23 ENCOUNTER — Telehealth: Payer: Self-pay | Admitting: *Deleted

## 2022-07-23 NOTE — Telephone Encounter (Signed)
PC to patient, informed him of Dr Shadad's message below, he verbalizes understanding. 

## 2022-07-23 NOTE — Telephone Encounter (Signed)
-----   Message from Wyatt Portela, MD sent at 07/23/2022  8:55 AM EST ----- Please let him know his scan looks unchanged. No new areas of concern

## 2022-08-18 ENCOUNTER — Inpatient Hospital Stay: Payer: Medicare Other | Attending: Oncology | Admitting: Oncology

## 2022-08-18 ENCOUNTER — Other Ambulatory Visit: Payer: Self-pay

## 2022-08-18 ENCOUNTER — Telehealth: Payer: Self-pay | Admitting: *Deleted

## 2022-08-18 VITALS — BP 141/59 | HR 62 | Temp 97.9°F | Resp 16 | Wt 189.0 lb

## 2022-08-18 DIAGNOSIS — C7951 Secondary malignant neoplasm of bone: Secondary | ICD-10-CM | POA: Diagnosis not present

## 2022-08-18 DIAGNOSIS — C786 Secondary malignant neoplasm of retroperitoneum and peritoneum: Secondary | ICD-10-CM | POA: Insufficient documentation

## 2022-08-18 DIAGNOSIS — Z923 Personal history of irradiation: Secondary | ICD-10-CM | POA: Insufficient documentation

## 2022-08-18 DIAGNOSIS — Z79899 Other long term (current) drug therapy: Secondary | ICD-10-CM | POA: Diagnosis not present

## 2022-08-18 DIAGNOSIS — C641 Malignant neoplasm of right kidney, except renal pelvis: Secondary | ICD-10-CM | POA: Diagnosis not present

## 2022-08-18 DIAGNOSIS — Z7982 Long term (current) use of aspirin: Secondary | ICD-10-CM | POA: Diagnosis not present

## 2022-08-18 DIAGNOSIS — C649 Malignant neoplasm of unspecified kidney, except renal pelvis: Secondary | ICD-10-CM | POA: Diagnosis not present

## 2022-08-18 NOTE — Telephone Encounter (Signed)
Per scheduling message Vanessa - called patient and lvm of upcoming appointments - requested call back to confirm 

## 2022-08-18 NOTE — Progress Notes (Signed)
Hematology and Oncology Follow Up Visit  Michael Knox [id number] 12/02/1945 76 y.o. 08/18/2022 1:33 PM Michael Knox, MDWeiss, Michael Ditch, PA-C   Principle Diagnosis: 48 year old with right kidney cancer diagnosed in 2013.  He developed recurrent disease with retroperitoneal mass in June 2023.   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     He remained on active surveillance without any evidence of relapsed disease. CT scan obtained on August 18, 2021 showed a 7.3 cm retroperitoneal mass involving the IVC and a 5 mm right upper lobe nodule.  Biopsy obtained in February 2023 showed recurrent disease including clear-cell renal cell carcinoma.    He underwent radiation therapy for a total of 10 fractions for a total of 50 Gray completed in June 2023.     Current therapy: Active surveillance.  Interim History: Michael Knox returns today for a follow-up visit.  Since the last visit, he reports no major complaints at this time.  He denies any nausea, vomiting or abdominal discomfort.  He does report to have chronic flank pain which has not changed at this time.  He denies any worsening neuropathy    Medications: I have reviewed the patient's current medications.  Current Outpatient Medications  Medication Sig Dispense Refill   amLODipine (NORVASC) 10 MG tablet Take 10 mg by mouth daily.     aspirin 81 MG EC tablet Take 1 tablet by mouth daily.     Cholecalciferol 25 MCG (1000 UT) capsule Take by mouth.     famotidine (PEPCID) 20 MG tablet Take by mouth.     Garlic 3435 MG CAPS Take by mouth.     Omega-3 1000 MG CAPS Take 1 capsule by mouth daily.     promethazine (PHENERGAN) 25 MG tablet Take 1 tablet (25 mg total) by mouth every 6 (six) hours as needed for nausea or vomiting. 30 tablet 0   rosuvastatin (CRESTOR) 20 MG tablet TAKE 1 TABLET BY MOUTH EVERY DAY FOR CHOLESTEROL      telmisartan (MICARDIS) 80 MG tablet TAKE ONE TABLET BY MOUTH EACH DAY FOR BLOOD PRESSURE     No current facility-administered medications for this visit.     Allergies:  Allergies  Allergen Reactions   Atorvastatin Other (See Comments)    Joint pain Joint Pain    Pitavastatin Other (See Comments)    Headache, constipation Constipation, headache    Simvastatin     Other reaction(s): Other (See Comments) Joint pain Joint pain       Physical Exam: Blood pressure (!) 141/59, pulse 62, temperature 97.9 F (36.6 C), temperature source Temporal, resp. rate 16, weight 189 lb (85.7 kg), SpO2 99 %. ECOG: 1 General appearance: alert and cooperative appeared without distress. Head: Normocephalic, without obvious abnormality Oropharynx: No oral thrush or ulcers. Eyes: No scleral icterus.  Pupils are equal and round reactive to light. Lymph nodes: Cervical, supraclavicular, and axillary nodes normal. Heart:regular rate and rhythm, S1, S2 normal, no murmur, click, rub or gallop Lung:chest clear, no wheezing, rales, normal symmetric air entry Abdomin: soft, non-tender, without masses or organomegaly. Neurological: No motor, sensory deficits.  Intact deep tendon reflexes. Skin: No rashes or lesions.  No ecchymosis or petechiae. Musculoskeletal: No joint deformity or effusion.    Lab Results: Lab Results  Component Value Date   WBC 6.7 10/26/2010   HGB 15.0 10/26/2010   HCT 42.3 10/26/2010   MCV  91.2 10/26/2010   PLT 201 10/26/2010     Chemistry      Component Value Date/Time   NA 137 10/26/2010 1151   K 4.1 10/26/2010 1151   CL 103 10/26/2010 1151   CO2 28 10/26/2010 1151   BUN 15 10/26/2010 1151   CREATININE 1.33 10/26/2010 1151      Component Value Date/Time   CALCIUM 9.9 10/26/2010 1151       Radiological Studies:  Narrative & Impression  CLINICAL DATA:  Subsequent treatment strategy for renal cell carcinoma.   EXAM: NUCLEAR MEDICINE PET SKULL BASE TO  THIGH   TECHNIQUE: 9.2 mCi F-18 FDG was injected intravenously. Full-ring PET imaging was performed from the skull base to thigh after the radiotracer. CT data was obtained and used for attenuation correction and anatomic localization.   Fasting blood glucose: 144 mg/dl   COMPARISON:  PET-CT 03/25/2022   FINDINGS: Mediastinal blood pool activity: SUV max 2.57   Liver activity: SUV max NA   NECK: Choose 1   Incidental CT findings: None.   CHEST: No hypermetabolic mediastinal or hilar nodes. No suspicious pulmonary nodules on the CT scan.   Incidental CT findings: Stable advanced vascular calcifications with extensive three-vessel coronary artery calcifications. No pulmonary nodules to suggest metastatic disease.   ABDOMEN/PELVIS: Persistent large partially necrotic right para aortic/retrocaval lymph node. It measures 5.3 x 4.2 cm and when remeasured on same image with the same imaging planes it measured 5.3 x 4.3 cm. SUV max is 6.33 and was previously 4.55. No new enlarged or hypermetabolic lymph nodes. No metastatic disease involving the liver or adrenal glands.   Incidental CT findings: Stable infrarenal abdominal aortic aneurysm measuring a maximum 5 cm.   SKELETON: No findings for osseous metastatic disease.   Incidental CT findings: None.   IMPRESSION: 1. Stable hypermetabolic partially necrotic right para-aortic node. 2. No new or progressive findings in the abdomen/pelvis and no evidence of metastatic disease involving the chest or bony structures.      Impression and Plan:   1.  Kidney cancer diagnosed in 2013.  He was found to have clear-cell tumor involving the right kidney and subsequently developed lymph node involvement and radiation therapy to the retroperitoneal lymph node in 2023.   His disease status was updated including review imaging studies including a PET scan on July 21, 2022.  He continues to have PET avid retroperitoneal mass although  it is unclear if it harbors any residual tumor.  It is possible that has residual disease in area.  Systemic treatment options were discussed at this time.  Single agent Pembrolizumab for adjuvant purposes, versus combination immunotherapy with ipilimumab and nivolumab or combination oral targeted therapy such as axitinib with Pembrolizumab are discussed.  Given his overall indolent disease previously treated tumor and minimal progression noted a period of observation could also be considered.  After discussion today, he is in favor of active surveillance and periodic monitoring of his tumor.  He has growth noted on subsequent scan systemic therapy could be considered.  I recommended follow-up in 3 months and update his staging scan in the next 3 to 6 months.   2.  Follow-up: He will return in 3 months and will establish care with Dr. Marin Olp at [facility] for convenience.   30  minutes were spent on this encounter.  Time was dedicated to updating disease status, treatment choices and outlining future plan of care discussion.     Zola Button, MD 12/20/20231:33  PM

## 2022-11-17 ENCOUNTER — Inpatient Hospital Stay: Payer: Medicare Other | Attending: Oncology

## 2022-11-17 ENCOUNTER — Other Ambulatory Visit: Payer: Self-pay

## 2022-11-17 ENCOUNTER — Inpatient Hospital Stay (HOSPITAL_BASED_OUTPATIENT_CLINIC_OR_DEPARTMENT_OTHER): Payer: Medicare Other | Admitting: Hematology & Oncology

## 2022-11-17 ENCOUNTER — Encounter: Payer: Self-pay | Admitting: Hematology & Oncology

## 2022-11-17 VITALS — BP 129/60 | HR 60 | Temp 97.9°F | Resp 18 | Ht 67.0 in | Wt 185.4 lb

## 2022-11-17 DIAGNOSIS — C641 Malignant neoplasm of right kidney, except renal pelvis: Secondary | ICD-10-CM

## 2022-11-17 DIAGNOSIS — C786 Secondary malignant neoplasm of retroperitoneum and peritoneum: Secondary | ICD-10-CM | POA: Insufficient documentation

## 2022-11-17 DIAGNOSIS — Z87891 Personal history of nicotine dependence: Secondary | ICD-10-CM

## 2022-11-17 DIAGNOSIS — C649 Malignant neoplasm of unspecified kidney, except renal pelvis: Secondary | ICD-10-CM

## 2022-11-17 DIAGNOSIS — Z905 Acquired absence of kidney: Secondary | ICD-10-CM

## 2022-11-17 LAB — RETICULOCYTES
Immature Retic Fract: 16.9 % — ABNORMAL HIGH (ref 2.3–15.9)
RBC.: 4.12 MIL/uL — ABNORMAL LOW (ref 4.22–5.81)
Retic Count, Absolute: 66.3 10*3/uL (ref 19.0–186.0)
Retic Ct Pct: 1.6 % (ref 0.4–3.1)

## 2022-11-17 LAB — CBC WITH DIFFERENTIAL (CANCER CENTER ONLY)
Abs Immature Granulocytes: 0.01 10*3/uL (ref 0.00–0.07)
Basophils Absolute: 0.1 10*3/uL (ref 0.0–0.1)
Basophils Relative: 2 %
Eosinophils Absolute: 0.2 10*3/uL (ref 0.0–0.5)
Eosinophils Relative: 5 %
HCT: 37 % — ABNORMAL LOW (ref 39.0–52.0)
Hemoglobin: 11.9 g/dL — ABNORMAL LOW (ref 13.0–17.0)
Immature Granulocytes: 0 %
Lymphocytes Relative: 19 %
Lymphs Abs: 0.8 10*3/uL (ref 0.7–4.0)
MCH: 28.7 pg (ref 26.0–34.0)
MCHC: 32.2 g/dL (ref 30.0–36.0)
MCV: 89.4 fL (ref 80.0–100.0)
Monocytes Absolute: 0.4 10*3/uL (ref 0.1–1.0)
Monocytes Relative: 8 %
Neutro Abs: 2.7 10*3/uL (ref 1.7–7.7)
Neutrophils Relative %: 66 %
Platelet Count: 201 10*3/uL (ref 150–400)
RBC: 4.14 MIL/uL — ABNORMAL LOW (ref 4.22–5.81)
RDW: 13.6 % (ref 11.5–15.5)
WBC Count: 4.2 10*3/uL (ref 4.0–10.5)
nRBC: 0 % (ref 0.0–0.2)

## 2022-11-17 LAB — CMP (CANCER CENTER ONLY)
ALT: 14 U/L (ref 0–44)
AST: 16 U/L (ref 15–41)
Albumin: 4.6 g/dL (ref 3.5–5.0)
Alkaline Phosphatase: 82 U/L (ref 38–126)
Anion gap: 9 (ref 5–15)
BUN: 20 mg/dL (ref 8–23)
CO2: 26 mmol/L (ref 22–32)
Calcium: 9.5 mg/dL (ref 8.9–10.3)
Chloride: 105 mmol/L (ref 98–111)
Creatinine: 1.56 mg/dL — ABNORMAL HIGH (ref 0.61–1.24)
GFR, Estimated: 45 mL/min — ABNORMAL LOW (ref >60)
Glucose, Bld: 195 mg/dL — ABNORMAL HIGH (ref 70–99)
Potassium: 4.2 mmol/L (ref 3.5–5.1)
Sodium: 140 mmol/L (ref 135–145)
Total Bilirubin: 0.5 mg/dL (ref 0.3–1.2)
Total Protein: 7.8 g/dL (ref 6.5–8.1)

## 2022-11-17 LAB — LACTATE DEHYDROGENASE: LDH: 176 U/L (ref 98–192)

## 2022-11-17 LAB — FERRITIN: Ferritin: 191 ng/mL (ref 24–336)

## 2022-11-17 NOTE — Progress Notes (Signed)
Hematology and Oncology Follow Up Visit  Michael Knox [id number] November 22, 1945 77 y.o. 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  He been followed by Dr. Alen Blew.  He is a very nice 77 year old white male.  He was in the [employer].  He served in the [employer] in [place].  As such, he is a true American hero.  He had a partial nephrectomy of his right kidney back in 2013.  Subsequently, he was found to have a recurrence in December 2022 with a retroperitoneal mass.  He subsequently underwent a biopsy in February 2023 which showed recurrent clear-cell carcinoma of the kidney.  He was then underwent radiation therapy for 10 treatments.  A total of 5000 rad was given.  This was completed in June 2023.  He has been followed.  He actually had a PET scan done at [facility].  This was done on 03/25/2022.  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  There is an SUV of 4.6.  No evidence of disease was noted elsewhere.  Recently, he underwent an endovascular procedure for an aneurysm.  I think this was done at [facility].  He was in the hospital the overnight.  He looks okay.  He comes in with his wife and daughter.  They are not all doing fairly well.  There is no issues with nausea or vomiting.  He has had no bleeding.  He has had no cough or shortness of breath.  He has had no change in bowel or bladder habits.  He is scheduled for another PET scan I think in April.  He had been seeing Dr. Alen Blew probably very every 3 to 4 months.  He used to smoke.  He probably had about a 20-pack-year history of tobacco use.  He stopped many years ago.  Currently, I would say that his performance status is probably ECOG 1.    Medications:  Current Outpatient Medications:    amLODipine (NORVASC) 10 MG tablet, Take 10 mg by mouth daily., Disp: , Rfl:     aspirin 81 MG EC tablet, Take 1 tablet by mouth daily., Disp: , Rfl:    Cholecalciferol 25 MCG (1000 UT) capsule, Take by mouth., Disp: , Rfl:    famotidine (PEPCID) 20 MG tablet, Take by mouth., Disp: , Rfl:    Garlic 123XX123 MG CAPS, Take by mouth., Disp: , Rfl:    Omega-3 1000 MG CAPS, Take 1 capsule by mouth daily., Disp: , Rfl:    promethazine (PHENERGAN) 25 MG tablet, Take 1 tablet (25 mg total) by mouth every 6 (six) hours as needed for nausea or vomiting., Disp: 30 tablet, Rfl: 0   rosuvastatin (CRESTOR) 20 MG tablet, TAKE 1 TABLET BY MOUTH EVERY DAY FOR CHOLESTEROL, Disp: , Rfl:    telmisartan (MICARDIS) 80 MG tablet, TAKE ONE TABLET BY MOUTH EACH DAY FOR BLOOD PRESSURE, Disp: , Rfl:   Allergies:  Allergies  Allergen Reactions   Atorvastatin Other (See Comments)    Joint pain     Pitavastatin Other (See Comments)    Headache, constipation    Simvastatin Other (See Comments)    Joint pain      Past Medical History, Surgical history, Social history, and Family History were reviewed and updated.  Review of Systems: Review of Systems  Constitutional: Negative.   HENT:  Negative.    Eyes: Negative.   Respiratory: Negative.    Cardiovascular: Negative.   Gastrointestinal: Negative.   Endocrine: Negative.   Genitourinary: Negative.    Musculoskeletal: Negative.   Skin: Negative.   Neurological: Negative.   Hematological: Negative.   Psychiatric/Behavioral: Negative.      Physical Exam:  height is 5\' 7"  (1.702 m) and weight is 185 lb 6.4 oz (84.1 kg). His oral temperature is 97.9 F (36.6 C). His blood pressure is 129/60 and his pulse is 60. His respiration is 18 and oxygen saturation is 100%.   Wt Readings from Last 3 Encounters:  11/17/22 185 lb 6.4 oz (84.1 kg)  08/18/22 189 lb (85.7 kg)  05/12/22 187 lb 6.4 oz (85 kg)    Physical Exam Vitals reviewed.  HENT:     Head: Normocephalic and atraumatic.  Eyes:     Pupils: Pupils are equal, round, and reactive to  light.  Cardiovascular:     Rate and Rhythm: Normal rate and regular rhythm.     Heart sounds: Normal heart sounds.  Pulmonary:     Effort: Pulmonary effort is normal.     Breath sounds: Normal breath sounds.  Abdominal:     General: Bowel sounds are normal.     Palpations: Abdomen is soft.  Musculoskeletal:        General: No tenderness or deformity. Normal range of motion.     Cervical back: Normal range of motion.  Lymphadenopathy:     Cervical: No cervical adenopathy.  Skin:    General: Skin is warm and dry.     Findings: No erythema or rash.  Neurological:     Mental Status: He is alert and oriented to person, place, and time.  Psychiatric:        Behavior: Behavior normal.        Thought Content: Thought content normal.        Judgment: Judgment normal.      Lab Results  Component Value Date   WBC 4.2 11/17/2022   HGB 11.9 (L) 11/17/2022   HCT 37.0 (L) 11/17/2022   MCV 89.4 11/17/2022   PLT 201 11/17/2022     Chemistry      Component Value Date/Time   NA 140 11/17/2022 1332   K 4.2 11/17/2022 1332   CL 105 11/17/2022 1332   CO2 26 11/17/2022 1332   BUN 20 11/17/2022 1332   CREATININE 1.56 (H) 11/17/2022 1332      Component Value Date/Time   CALCIUM 9.5 11/17/2022 1332   ALKPHOS 82 11/17/2022 1332   AST 16 11/17/2022 1332   ALT 14 11/17/2022 1332   BILITOT 0.5 11/17/2022 1332     Impression and Plan: Michael Knox is a very nice 77 year old white male.  He has recurrent clear-cell carcinoma of the kidney.  He had a early stage kidney cancer of the right kidney back in 2013.  He had a partial nephrectomy.  He subsequently had a recurrence.  This was treated with radiation therapy.  He will be interesting to see what his PET scan shows at [facility].  I would have to say that he probably is can be at risk for another recurrence.  I would think that he probably would have gotten immunotherapy but may have refused this when he saw his initial oncologist.  For  right now, we will go ahead and plan to get him back in another 3 or 4 months.  At some point, it would not surprise  me if we would have to treat him.  He is a very nice.  I enjoyed talking with him.  Again he is a true [place] hero for fighting for country with the [employer].   Volanda Napoleon, MD 3/20/20242:56 PM

## 2022-11-18 LAB — ERYTHROPOIETIN: Erythropoietin: 26.8 m[IU]/mL — ABNORMAL HIGH (ref 2.6–18.5)

## 2022-11-18 LAB — IRON AND IRON BINDING CAPACITY (CC-WL,HP ONLY)
Iron: 37 ug/dL — ABNORMAL LOW (ref 45–182)
Saturation Ratios: 13 % — ABNORMAL LOW (ref 17.9–39.5)
TIBC: 280 ug/dL (ref 250–450)
UIBC: 243 ug/dL (ref 117–376)

## 2022-12-24 ENCOUNTER — Telehealth: Payer: Self-pay | Admitting: *Deleted

## 2022-12-24 NOTE — Telephone Encounter (Signed)
Good morning,  After reviewing your prior lab work and iron results., Dr. Myna Hidalgo advised he send his apologies for the amount of time between the results and his response.  He advised  "His iron levels are on the low side. I really think that we going to have to consider some IV iron for him. The fact that he is on Pepcid might minimize oral iron from working. If he wants IV iron, we can set him up for this. "  Please let us know if you would like to come in for IV Iron infusion or continue the oral Iron and discuss options at your next office visit.  Thank you for using MyChart and have a great day. Corrie Dandy, [place]

## 2022-12-24 NOTE — Telephone Encounter (Signed)
-----   Message from [name], MD sent at 12/23/2022  4:42 PM EDT ----- Please call and totally apologize for me being so late in reviewing his labs.  His iron levels are on the low side.  I really think that we going to have to consider some IV iron for him.  The fact that he is on Pepcid might minimize oral iron from working.  If he wants IV iron, we can set him up for this.  Again, totally apologize for me taking so long to review his labs and getting back with him.  Thanks.  Cindee Lame

## 2023-02-18 ENCOUNTER — Ambulatory Visit: Payer: Medicare Other | Admitting: Hematology & Oncology

## 2023-02-18 ENCOUNTER — Other Ambulatory Visit: Payer: Medicare Other

## 2023-02-24 ENCOUNTER — Inpatient Hospital Stay: Payer: Medicare Other | Admitting: Hematology & Oncology

## 2023-02-24 ENCOUNTER — Inpatient Hospital Stay: Payer: Medicare Other | Attending: Oncology

## 2023-02-24 ENCOUNTER — Encounter: Payer: Self-pay | Admitting: Hematology & Oncology

## 2023-02-24 VITALS — BP 159/54 | HR 58 | Temp 98.7°F | Resp 20 | Ht 67.0 in | Wt 186.1 lb

## 2023-02-24 DIAGNOSIS — R918 Other nonspecific abnormal finding of lung field: Secondary | ICD-10-CM | POA: Insufficient documentation

## 2023-02-24 DIAGNOSIS — C649 Malignant neoplasm of unspecified kidney, except renal pelvis: Secondary | ICD-10-CM

## 2023-02-24 DIAGNOSIS — C641 Malignant neoplasm of right kidney, except renal pelvis: Secondary | ICD-10-CM | POA: Insufficient documentation

## 2023-02-24 DIAGNOSIS — C786 Secondary malignant neoplasm of retroperitoneum and peritoneum: Secondary | ICD-10-CM | POA: Diagnosis present

## 2023-02-24 DIAGNOSIS — Z905 Acquired absence of kidney: Secondary | ICD-10-CM | POA: Diagnosis not present

## 2023-02-24 LAB — CMP (CANCER CENTER ONLY)
ALT: 19 U/L (ref 0–44)
AST: 17 U/L (ref 15–41)
Albumin: 4.8 g/dL (ref 3.5–5.0)
Alkaline Phosphatase: 77 U/L (ref 38–126)
Anion gap: 9 (ref 5–15)
BUN: 25 mg/dL — ABNORMAL HIGH (ref 8–23)
CO2: 26 mmol/L (ref 22–32)
Calcium: 9.8 mg/dL (ref 8.9–10.3)
Chloride: 107 mmol/L (ref 98–111)
Creatinine: 1.76 mg/dL — ABNORMAL HIGH (ref 0.61–1.24)
GFR, Estimated: 39 mL/min — ABNORMAL LOW (ref >60)
Glucose, Bld: 95 mg/dL (ref 70–99)
Potassium: 4.5 mmol/L (ref 3.5–5.1)
Sodium: 142 mmol/L (ref 135–145)
Total Bilirubin: 0.6 mg/dL (ref 0.3–1.2)
Total Protein: 8.1 g/dL (ref 6.5–8.1)

## 2023-02-24 LAB — CBC WITH DIFFERENTIAL (CANCER CENTER ONLY)
Abs Immature Granulocytes: 0.01 10*3/uL (ref 0.00–0.07)
Basophils Absolute: 0.1 10*3/uL (ref 0.0–0.1)
Basophils Relative: 1 %
Eosinophils Absolute: 0.2 10*3/uL (ref 0.0–0.5)
Eosinophils Relative: 3 %
HCT: 39.1 % (ref 39.0–52.0)
Hemoglobin: 12.6 g/dL — ABNORMAL LOW (ref 13.0–17.0)
Immature Granulocytes: 0 %
Lymphocytes Relative: 19 %
Lymphs Abs: 0.9 10*3/uL (ref 0.7–4.0)
MCH: 29.4 pg (ref 26.0–34.0)
MCHC: 32.2 g/dL (ref 30.0–36.0)
MCV: 91.4 fL (ref 80.0–100.0)
Monocytes Absolute: 0.5 10*3/uL (ref 0.1–1.0)
Monocytes Relative: 9 %
Neutro Abs: 3.3 10*3/uL (ref 1.7–7.7)
Neutrophils Relative %: 68 %
Platelet Count: 138 10*3/uL — ABNORMAL LOW (ref 150–400)
RBC: 4.28 MIL/uL (ref 4.22–5.81)
RDW: 14.8 % (ref 11.5–15.5)
WBC Count: 4.9 10*3/uL (ref 4.0–10.5)
nRBC: 0 % (ref 0.0–0.2)

## 2023-02-24 LAB — LACTATE DEHYDROGENASE: LDH: 163 U/L (ref 98–192)

## 2023-02-24 NOTE — Progress Notes (Signed)
Hematology and Oncology Follow Up Visit  BLADEN UMAR [id number] Jun 28, 1946 77 y.o. 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 Hanzlik is in for his follow-up.  He is doing pretty well.  We did see him back in March.  Since then, he actually had a CT angiogram of the chest/abdomen/pelvis.  This was done on 12/10/2022.  Everything looked relatively stable.  He did have the stents that were in place.  As far as his tumor is concerned, everything looked stable.  There is a stable 1.1 cm right upper pole renal mass.  He had multifocal pulmonary nodules that were stable.  They are not hypermetabolic on past scans.  He had a stable 5.5 cm necrotic right retroperitoneal lymph node.  He is eating well.  He is having no problems with nausea or vomiting.  He is having no problems with cough or shortness of breath.  He is having no change in bowel or bladder habits.  He has had no bleeding.  He has had no leg swelling.  Overall, I would say that his performance status is probably ECOG 1.    Medications:  Current Outpatient Medications:    amLODipine (NORVASC) 10 MG tablet, Take 10 mg by mouth daily., Disp: , Rfl:    aspirin 81 MG EC tablet, Take 1 tablet by mouth daily., Disp: , Rfl:    Cholecalciferol 25 MCG (1000 UT) capsule, Take by mouth daily., Disp: , Rfl:    famotidine (PEPCID) 20 MG tablet, Take by mouth daily., Disp: , Rfl:    Garlic 1000 MG CAPS, Take by mouth daily., Disp: , Rfl:    Omega-3 1000 MG CAPS, Take 1 capsule by mouth daily., Disp: , Rfl:    rosuvastatin (CRESTOR) 20 MG tablet, TAKE 1 TABLET BY MOUTH EVERY DAY FOR CHOLESTEROL, Disp: , Rfl:    telmisartan (MICARDIS) 80 MG tablet, TAKE ONE TABLET BY MOUTH EACH DAY FOR BLOOD PRESSURE, Disp: , Rfl:    promethazine (PHENERGAN) 25 MG tablet, Take 1 tablet (25 mg total) by mouth every 6 (six) hours as  needed for nausea or vomiting. (Patient not taking: Reported on 02/24/2023), Disp: 30 tablet, Rfl: 0  Allergies:  Allergies  Allergen Reactions   Atorvastatin Other (See Comments)    Joint pain     Pitavastatin Other (See Comments)    Headache, constipation    Simvastatin Other (See Comments)    Joint pain      Past Medical History, Surgical history, Social history, and Family History were reviewed and updated.  Review of Systems: Review of Systems  Constitutional: Negative.   HENT:  Negative.    Eyes: Negative.   Respiratory: Negative.    Cardiovascular: Negative.   Gastrointestinal: Negative.   Endocrine: Negative.   Genitourinary: Negative.    Musculoskeletal: Negative.   Skin: Negative.   Neurological: Negative.   Hematological: Negative.   Psychiatric/Behavioral: Negative.      Physical Exam:  height is 5\' 7"  (1.702 m) and weight is 186 lb 1.9 oz (84.4 kg). His oral temperature is 98.7 F (37.1 C). His blood pressure is 159/54 (abnormal) and his pulse is 58 (abnormal). His respiration is 20 and oxygen saturation is 100%.   Wt Readings from Last 3 Encounters:  02/24/23 186 lb 1.9 oz (84.4 kg)  11/17/22 185 lb 6.4 oz (84.1 kg)  08/18/22 189 lb (85.7 kg)  Physical Exam Vitals reviewed.  HENT:     Head: Normocephalic and atraumatic.  Eyes:     Pupils: Pupils are equal, round, and reactive to light.  Cardiovascular:     Rate and Rhythm: Normal rate and regular rhythm.     Heart sounds: Normal heart sounds.  Pulmonary:     Effort: Pulmonary effort is normal.     Breath sounds: Normal breath sounds.  Abdominal:     General: Bowel sounds are normal.     Palpations: Abdomen is soft.  Musculoskeletal:        General: No tenderness or deformity. Normal range of motion.     Cervical back: Normal range of motion.  Lymphadenopathy:     Cervical: No cervical adenopathy.  Skin:    General: Skin is warm and dry.     Findings: No erythema or rash.   Neurological:     Mental Status: He is alert and oriented to person, place, and time.  Psychiatric:        Behavior: Behavior normal.        Thought Content: Thought content normal.        Judgment: Judgment normal.     Lab Results  Component Value Date   WBC 4.9 02/24/2023   HGB 12.6 (L) 02/24/2023   HCT 39.1 02/24/2023   MCV 91.4 02/24/2023   PLT 138 (L) 02/24/2023     Chemistry      Component Value Date/Time   NA 140 11/17/2022 1332   K 4.2 11/17/2022 1332   CL 105 11/17/2022 1332   CO2 26 11/17/2022 1332   BUN 20 11/17/2022 1332   CREATININE 1.56 (H) 11/17/2022 1332      Component Value Date/Time   CALCIUM 9.5 11/17/2022 1332   ALKPHOS 82 11/17/2022 1332   AST 16 11/17/2022 1332   ALT 14 11/17/2022 1332   BILITOT 0.5 11/17/2022 1332     Impression and Plan: Mr. Wanat is a very nice 77 year old white male.  He has a history of recurrent clear-cell carcinoma of the kidney.  He had a early stage kidney cancer of the right kidney back in 2013.  He had a partial nephrectomy.  He subsequently had a recurrence.  This was treated with radiation therapy.  I am glad that the CT scan did not show anything that looked suspicious for progression of his cancer.  We still to monitor him with scans.  I probably would repeat another set of scans at the end of the year.  I would like to see him back in September.  I think we can get him through the Summer.  I know that he and his family will have a good time being together.   [name], MD 6/27/20243:28 PM

## 2023-02-24 NOTE — Progress Notes (Signed)
BP elevated, instructed to take BPs  and if remains elevated, notify PCP, verbalized understanding.

## 2023-05-02 ENCOUNTER — Encounter: Payer: Self-pay | Admitting: Hematology & Oncology

## 2023-05-26 ENCOUNTER — Inpatient Hospital Stay: Payer: Medicare Other | Attending: Hematology & Oncology

## 2023-05-26 ENCOUNTER — Inpatient Hospital Stay (HOSPITAL_BASED_OUTPATIENT_CLINIC_OR_DEPARTMENT_OTHER): Payer: Medicare Other | Admitting: Hematology & Oncology

## 2023-05-26 ENCOUNTER — Other Ambulatory Visit: Payer: Self-pay

## 2023-05-26 ENCOUNTER — Encounter: Payer: Self-pay | Admitting: Hematology & Oncology

## 2023-05-26 VITALS — BP 133/53 | HR 57 | Temp 97.9°F | Resp 18 | Wt 186.0 lb

## 2023-05-26 DIAGNOSIS — Z905 Acquired absence of kidney: Secondary | ICD-10-CM | POA: Insufficient documentation

## 2023-05-26 DIAGNOSIS — C649 Malignant neoplasm of unspecified kidney, except renal pelvis: Secondary | ICD-10-CM | POA: Diagnosis not present

## 2023-05-26 DIAGNOSIS — Z923 Personal history of irradiation: Secondary | ICD-10-CM | POA: Insufficient documentation

## 2023-05-26 DIAGNOSIS — Z85528 Personal history of other malignant neoplasm of kidney: Secondary | ICD-10-CM | POA: Insufficient documentation

## 2023-05-26 LAB — CMP (CANCER CENTER ONLY)
ALT: 16 U/L (ref 0–44)
AST: 17 U/L (ref 15–41)
Albumin: 4.6 g/dL (ref 3.5–5.0)
Alkaline Phosphatase: 84 U/L (ref 38–126)
Anion gap: 10 (ref 5–15)
BUN: 26 mg/dL — ABNORMAL HIGH (ref 8–23)
CO2: 27 mmol/L (ref 22–32)
Calcium: 9.5 mg/dL (ref 8.9–10.3)
Chloride: 107 mmol/L (ref 98–111)
Creatinine: 2 mg/dL — ABNORMAL HIGH (ref 0.61–1.24)
GFR, Estimated: 33 mL/min — ABNORMAL LOW (ref >60)
Glucose, Bld: 139 mg/dL — ABNORMAL HIGH (ref 70–99)
Potassium: 5 mmol/L (ref 3.5–5.1)
Sodium: 144 mmol/L (ref 135–145)
Total Bilirubin: 0.6 mg/dL (ref 0.3–1.2)
Total Protein: 7.5 g/dL (ref 6.5–8.1)

## 2023-05-26 LAB — CBC WITH DIFFERENTIAL (CANCER CENTER ONLY)
Abs Immature Granulocytes: 0.01 10*3/uL (ref 0.00–0.07)
Basophils Absolute: 0.1 10*3/uL (ref 0.0–0.1)
Basophils Relative: 1 %
Eosinophils Absolute: 0.2 10*3/uL (ref 0.0–0.5)
Eosinophils Relative: 4 %
HCT: 41.5 % (ref 39.0–52.0)
Hemoglobin: 13.5 g/dL (ref 13.0–17.0)
Immature Granulocytes: 0 %
Lymphocytes Relative: 23 %
Lymphs Abs: 1.2 10*3/uL (ref 0.7–4.0)
MCH: 29.9 pg (ref 26.0–34.0)
MCHC: 32.5 g/dL (ref 30.0–36.0)
MCV: 92 fL (ref 80.0–100.0)
Monocytes Absolute: 0.4 10*3/uL (ref 0.1–1.0)
Monocytes Relative: 8 %
Neutro Abs: 3.1 10*3/uL (ref 1.7–7.7)
Neutrophils Relative %: 64 %
Platelet Count: 151 10*3/uL (ref 150–400)
RBC: 4.51 MIL/uL (ref 4.22–5.81)
RDW: 13.6 % (ref 11.5–15.5)
WBC Count: 4.9 10*3/uL (ref 4.0–10.5)
nRBC: 0 % (ref 0.0–0.2)

## 2023-05-26 LAB — LACTATE DEHYDROGENASE: LDH: 162 U/L (ref 98–192)

## 2023-05-26 NOTE — Progress Notes (Signed)
Hematology and Oncology Follow Up Visit  Michael Knox [id number] [birth year] 77 y.o. 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  We last saw him back in June.  Since then, has been doing pretty well.  He comes in with his wife and daughter.  He has had a pretty quiet summer.  He has had no problems with pain.  He has had no change in bowel or bladder habits.  He has had no issues with nausea or vomiting.  He has had no cough or shortness of breath.  He has had no bleeding.  I think he may have had COVID a couple months ago.  He has had no rashes.  There has been no bleeding.  Currently, I would say that his performance status is probably ECOG 1.   Medications:  Current Outpatient Medications:    amLODipine (NORVASC) 10 MG tablet, Take 10 mg by mouth daily., Disp: , Rfl:    aspirin 81 MG EC tablet, Take 1 tablet by mouth daily., Disp: , Rfl:    Cholecalciferol 25 MCG (1000 UT) capsule, Take by mouth daily., Disp: , Rfl:    famotidine (PEPCID) 20 MG tablet, Take by mouth daily., Disp: , Rfl:    Garlic 1000 MG CAPS, Take by mouth daily., Disp: , Rfl:    Omega-3 1000 MG CAPS, Take 1 capsule by mouth daily., Disp: , Rfl:    promethazine (PHENERGAN) 25 MG tablet, Take 1 tablet (25 mg total) by mouth every 6 (six) hours as needed for nausea or vomiting. (Patient not taking: Reported on 02/24/2023), Disp: 30 tablet, Rfl: 0   rosuvastatin (CRESTOR) 20 MG tablet, TAKE 1 TABLET BY MOUTH EVERY DAY FOR CHOLESTEROL, Disp: , Rfl:    telmisartan (MICARDIS) 80 MG tablet, TAKE ONE TABLET BY MOUTH EACH DAY FOR BLOOD PRESSURE, Disp: , Rfl:   Allergies:  Allergies  Allergen Reactions   Atorvastatin Other (See Comments)    Joint pain     Pitavastatin Other (See Comments)    Headache, constipation    Simvastatin Other (See Comments)     Joint pain      Past Medical History, Surgical history, Social history, and Family History were reviewed and updated.  Review of Systems: Review of Systems  Constitutional: Negative.   HENT:  Negative.    Eyes: Negative.   Respiratory: Negative.    Cardiovascular: Negative.   Gastrointestinal: Negative.   Endocrine: Negative.   Genitourinary: Negative.    Musculoskeletal: Negative.   Skin: Negative.   Neurological: Negative.   Hematological: Negative.   Psychiatric/Behavioral: Negative.      Physical Exam:  weight is 186 lb (84.4 kg). His oral temperature is 97.9 F (36.6 C). His blood pressure is 133/53 (abnormal) and his pulse is 57 (abnormal). His respiration is 18 and oxygen saturation is 99%.   Wt Readings from Last 3 Encounters:  05/26/23 186 lb (84.4 kg)  02/24/23 186 lb 1.9 oz (84.4 kg)  11/17/22 185 lb 6.4 oz (84.1 kg)    Physical Exam Vitals reviewed.  HENT:     Head: Normocephalic and atraumatic.  Eyes:     Pupils: Pupils are equal, round, and reactive to light.  Cardiovascular:     Rate and Rhythm: Normal rate and regular rhythm.     Heart sounds: Normal heart sounds.  Pulmonary:  Effort: Pulmonary effort is normal.     Breath sounds: Normal breath sounds.  Abdominal:     General: Bowel sounds are normal.     Palpations: Abdomen is soft.  Musculoskeletal:        General: No tenderness or deformity. Normal range of motion.     Cervical back: Normal range of motion.  Lymphadenopathy:     Cervical: No cervical adenopathy.  Skin:    General: Skin is warm and dry.     Findings: No erythema or rash.  Neurological:     Mental Status: He is alert and oriented to person, place, and time.  Psychiatric:        Behavior: Behavior normal.        Thought Content: Thought content normal.        Judgment: Judgment normal.     Lab Results  Component Value Date   WBC 4.9 05/26/2023   HGB 13.5 05/26/2023   HCT 41.5 05/26/2023   MCV 92.0 05/26/2023    PLT 151 05/26/2023     Chemistry      Component Value Date/Time   NA 144 05/26/2023 1452   K 5.0 05/26/2023 1452   CL 107 05/26/2023 1452   CO2 27 05/26/2023 1452   BUN 26 (H) 05/26/2023 1452   CREATININE 2.00 (H) 05/26/2023 1452      Component Value Date/Time   CALCIUM 9.5 05/26/2023 1452   ALKPHOS 84 05/26/2023 1452   AST 17 05/26/2023 1452   ALT 16 05/26/2023 1452   BILITOT 0.6 05/26/2023 1452     Impression and Plan: Michael Knox is a very nice 77 year old white male.  He has a history of recurrent clear-cell carcinoma of the kidney.  He had a early stage kidney cancer of the right kidney back in 2013.  He had a partial nephrectomy.  He subsequently had a recurrence.  This was treated with radiation therapy.  We really have to get him set up with another CT scan.  The last CT scan was done back in April.  I will try to set 1 up for him sometime in November.  Again he looks great.  Hopefully, we will not see that there is any evidence of progressive disease.  We will try to get the CT scan done the same day that I see him.    [name], MD 9/26/20243:42 PM

## 2023-06-01 ENCOUNTER — Encounter (HOSPITAL_BASED_OUTPATIENT_CLINIC_OR_DEPARTMENT_OTHER): Payer: Self-pay

## 2023-06-01 ENCOUNTER — Ambulatory Visit (HOSPITAL_BASED_OUTPATIENT_CLINIC_OR_DEPARTMENT_OTHER)
Admission: RE | Admit: 2023-06-01 | Discharge: 2023-06-01 | Disposition: A | Discharge status: Home / Self Care | Payer: Medicare Other | Source: Ambulatory Visit | Attending: Hematology & Oncology | Admitting: Hematology & Oncology

## 2023-06-01 DIAGNOSIS — M439 Deforming dorsopathy, unspecified: Secondary | ICD-10-CM | POA: Diagnosis not present

## 2023-06-01 DIAGNOSIS — R918 Other nonspecific abnormal finding of lung field: Secondary | ICD-10-CM | POA: Diagnosis not present

## 2023-06-01 DIAGNOSIS — I714 Abdominal aortic aneurysm, without rupture, unspecified: Secondary | ICD-10-CM | POA: Diagnosis not present

## 2023-06-01 DIAGNOSIS — C641 Malignant neoplasm of right kidney, except renal pelvis: Secondary | ICD-10-CM | POA: Insufficient documentation

## 2023-06-01 DIAGNOSIS — C649 Malignant neoplasm of unspecified kidney, except renal pelvis: Secondary | ICD-10-CM

## 2023-06-10 ENCOUNTER — Encounter: Payer: Self-pay | Admitting: Hematology & Oncology

## 2023-06-10 ENCOUNTER — Telehealth: Payer: Self-pay | Admitting: *Deleted

## 2023-06-10 NOTE — Telephone Encounter (Signed)
This nurse called patient and spoke to him and his wife regarding the CT scan results. Dr. Myna Hidalgo would like them to come in and review the results with them in person. They verbalized understanding. LOS sent to scheduling.

## 2023-06-14 ENCOUNTER — Inpatient Hospital Stay: Payer: Medicare Other | Attending: Hematology & Oncology | Admitting: Hematology & Oncology

## 2023-06-14 ENCOUNTER — Encounter: Payer: Self-pay | Admitting: Hematology & Oncology

## 2023-06-14 ENCOUNTER — Other Ambulatory Visit: Payer: Self-pay

## 2023-06-14 VITALS — BP 142/52 | HR 54 | Temp 97.8°F | Resp 17 | Wt 186.0 lb

## 2023-06-14 DIAGNOSIS — C649 Malignant neoplasm of unspecified kidney, except renal pelvis: Secondary | ICD-10-CM | POA: Diagnosis not present

## 2023-06-14 DIAGNOSIS — Z905 Acquired absence of kidney: Secondary | ICD-10-CM | POA: Diagnosis not present

## 2023-06-14 DIAGNOSIS — C7801 Secondary malignant neoplasm of right lung: Secondary | ICD-10-CM | POA: Insufficient documentation

## 2023-06-14 DIAGNOSIS — C7802 Secondary malignant neoplasm of left lung: Secondary | ICD-10-CM | POA: Insufficient documentation

## 2023-06-14 DIAGNOSIS — C641 Malignant neoplasm of right kidney, except renal pelvis: Secondary | ICD-10-CM | POA: Diagnosis present

## 2023-06-14 NOTE — Progress Notes (Signed)
Hematology and Oncology Follow Up Visit  Michael Knox [id number] [birth year] 77 y.o. 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  We did do a CT of his body as part of his restaging.  This was done on 06/01/2023.  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  He has enlarging pulmonary nodules bilaterally.  Retroperitoneal lymph node which is stable at 5.5 x 4.4 cm.  Had a long talk with he and his family.  I told him that he was showing that his disease was progressing.  As such, we need to consider some form of systemic therapy.  I know that he has had radiotherapy in the past for this retroperitoneal recurrence.  Think immunotherapy would not be a bad idea for him.  However, he is not certain that he wants treatment right now.  I totally understand this.  I told him that we have the "luxury" of waiting a little bit.  If he wants to wait and do follow-up serial CT scans, that would be reasonable.  Regardless, I think he is going need to have MRI of the brain to make sure there is nothing going on in the brain.  He has had no neurological symptoms.  His appetite is doing quite well.  He has had no nausea or vomiting.  He is on no cough or shortness of breath.  He has had no chest pain.  He has had no bleeding.  Overall, I would say that his performance status is probably ECOG 1.   Medications:  Current Outpatient Medications:    amLODipine (NORVASC) 10 MG tablet, Take 10 mg by mouth daily., Disp: , Rfl:    aspirin 81 MG EC tablet, Take 1 tablet by mouth daily., Disp: , Rfl:    Cholecalciferol 25 MCG (1000 UT) capsule, Take by mouth daily., Disp: , Rfl:    famotidine (PEPCID) 20 MG tablet, Take by mouth daily., Disp: , Rfl:    Garlic 1000 MG CAPS, Take by mouth daily., Disp: , Rfl:     Omega-3 1000 MG CAPS, Take 1 capsule by mouth daily., Disp: , Rfl:    promethazine (PHENERGAN) 25 MG tablet, Take 1 tablet (25 mg total) by mouth every 6 (six) hours as needed for nausea or vomiting. (Patient not taking: Reported on 02/24/2023), Disp: 30 tablet, Rfl: 0   rosuvastatin (CRESTOR) 20 MG tablet, TAKE 1 TABLET BY MOUTH EVERY DAY FOR CHOLESTEROL, Disp: , Rfl:    telmisartan (MICARDIS) 80 MG tablet, TAKE ONE TABLET BY MOUTH EACH DAY FOR BLOOD PRESSURE, Disp: , Rfl:   Allergies:  Allergies  Allergen Reactions   Atorvastatin Other (See Comments)    Joint pain     Pitavastatin Other (See Comments)    Headache, constipation    Simvastatin Other (See Comments)    Joint pain      Past Medical History, Surgical history, Social history, and Family History were reviewed and updated.  Review of Systems: Review of Systems  Constitutional: Negative.   HENT:  Negative.    Eyes: Negative.   Respiratory: Negative.    Cardiovascular: Negative.   Gastrointestinal: Negative.   Endocrine: Negative.   Genitourinary: Negative.    Musculoskeletal: Negative.   Skin: Negative.   Neurological: Negative.   Hematological: Negative.   Psychiatric/Behavioral: Negative.  Physical Exam:  weight is 186 lb (84.4 kg). His oral temperature is 97.8 F (36.6 C). His blood pressure is 142/52 (abnormal) and his pulse is 54 (abnormal). His respiration is 17 and oxygen saturation is 98%.   Wt Readings from Last 3 Encounters:  06/14/23 186 lb (84.4 kg)  05/26/23 186 lb (84.4 kg)  02/24/23 186 lb 1.9 oz (84.4 kg)    Physical Exam Vitals reviewed.  HENT:     Head: Normocephalic and atraumatic.  Eyes:     Pupils: Pupils are equal, round, and reactive to light.  Cardiovascular:     Rate and Rhythm: Normal rate and regular rhythm.     Heart sounds: Normal heart sounds.  Pulmonary:     Effort: Pulmonary effort is normal.     Breath sounds: Normal breath sounds.  Abdominal:     General:  Bowel sounds are normal.     Palpations: Abdomen is soft.  Musculoskeletal:        General: No tenderness or deformity. Normal range of motion.     Cervical back: Normal range of motion.  Lymphadenopathy:     Cervical: No cervical adenopathy.  Skin:    General: Skin is warm and dry.     Findings: No erythema or rash.  Neurological:     Mental Status: He is alert and oriented to person, place, and time.  Psychiatric:        Behavior: Behavior normal.        Thought Content: Thought content normal.        Judgment: Judgment normal.      Lab Results  Component Value Date   WBC 4.9 05/26/2023   HGB 13.5 05/26/2023   HCT 41.5 05/26/2023   MCV 92.0 05/26/2023   PLT 151 05/26/2023     Chemistry      Component Value Date/Time   NA 144 05/26/2023 1452   K 5.0 05/26/2023 1452   CL 107 05/26/2023 1452   CO2 27 05/26/2023 1452   BUN 26 (H) 05/26/2023 1452   CREATININE 2.00 (H) 05/26/2023 1452      Component Value Date/Time   CALCIUM 9.5 05/26/2023 1452   ALKPHOS 84 05/26/2023 1452   AST 17 05/26/2023 1452   ALT 16 05/26/2023 1452   BILITOT 0.6 05/26/2023 1452     Impression and Plan: Michael Knox is a very nice 77 year old white male.  He has a history of recurrent clear-cell carcinoma of the kidney.  He had a early stage kidney cancer of the right kidney back in 2013.  He had a partial nephrectomy.  He subsequently had a recurrence.  This was treated with radiation therapy.  Again, the recent CT scan does seem to suggest that he has progressive disease in his lungs.  I am not surprised by this.  He will decide with his family as to whether or not he would like any immunotherapy.  Again I think immunotherapy would be a good way of treating him.  We will go ahead and send him over the MRI of the brain.  We will try to get this next week.  I would like to probably see him back in about 3 weeks.  At that point, we should be able to come over the a decision as far as  treatment.  Obviously, if he does have any brain metastasis, we will need to treat him much more quickly.   [name], MD 10/15/20242:03 PM

## 2023-06-20 ENCOUNTER — Ambulatory Visit (HOSPITAL_COMMUNITY)
Admission: RE | Admit: 2023-06-20 | Discharge: 2023-06-20 | Disposition: A | Discharge status: Home / Self Care | Payer: Medicare Other | Source: Ambulatory Visit | Attending: Hematology & Oncology | Admitting: Hematology & Oncology

## 2023-06-20 DIAGNOSIS — C649 Malignant neoplasm of unspecified kidney, except renal pelvis: Secondary | ICD-10-CM | POA: Diagnosis present

## 2023-06-20 MED ORDER — GADOBUTROL 1 MMOL/ML IV SOLN
7.0000 mL | Freq: Once | INTRAVENOUS | Status: AC | PRN
  Administered 2023-06-20: 7 mL via INTRAVENOUS

## 2023-07-11 ENCOUNTER — Other Ambulatory Visit: Payer: Self-pay

## 2023-07-11 ENCOUNTER — Encounter: Payer: Self-pay | Admitting: Hematology & Oncology

## 2023-07-11 ENCOUNTER — Inpatient Hospital Stay (HOSPITAL_BASED_OUTPATIENT_CLINIC_OR_DEPARTMENT_OTHER): Payer: Medicare Other | Admitting: Hematology & Oncology

## 2023-07-11 ENCOUNTER — Inpatient Hospital Stay: Payer: Medicare Other | Attending: Hematology & Oncology

## 2023-07-11 VITALS — BP 132/53 | HR 58 | Temp 98.3°F | Resp 16 | Wt 186.0 lb

## 2023-07-11 DIAGNOSIS — C7989 Secondary malignant neoplasm of other specified sites: Secondary | ICD-10-CM | POA: Diagnosis not present

## 2023-07-11 DIAGNOSIS — C641 Malignant neoplasm of right kidney, except renal pelvis: Secondary | ICD-10-CM | POA: Insufficient documentation

## 2023-07-11 DIAGNOSIS — C7802 Secondary malignant neoplasm of left lung: Secondary | ICD-10-CM | POA: Insufficient documentation

## 2023-07-11 DIAGNOSIS — C649 Malignant neoplasm of unspecified kidney, except renal pelvis: Secondary | ICD-10-CM

## 2023-07-11 DIAGNOSIS — Z79899 Other long term (current) drug therapy: Secondary | ICD-10-CM | POA: Diagnosis not present

## 2023-07-11 DIAGNOSIS — C7801 Secondary malignant neoplasm of right lung: Secondary | ICD-10-CM | POA: Insufficient documentation

## 2023-07-11 DIAGNOSIS — Z905 Acquired absence of kidney: Secondary | ICD-10-CM | POA: Insufficient documentation

## 2023-07-11 DIAGNOSIS — Z5112 Encounter for antineoplastic immunotherapy: Secondary | ICD-10-CM | POA: Diagnosis present

## 2023-07-11 LAB — CMP (CANCER CENTER ONLY)
ALT: 14 U/L (ref 0–44)
AST: 16 U/L (ref 15–41)
Albumin: 4.8 g/dL (ref 3.5–5.0)
Alkaline Phosphatase: 95 U/L (ref 38–126)
Anion gap: 9 (ref 5–15)
BUN: 26 mg/dL — ABNORMAL HIGH (ref 8–23)
CO2: 29 mmol/L (ref 22–32)
Calcium: 10.2 mg/dL (ref 8.9–10.3)
Chloride: 105 mmol/L (ref 98–111)
Creatinine: 1.98 mg/dL — ABNORMAL HIGH (ref 0.61–1.24)
GFR, Estimated: 34 mL/min — ABNORMAL LOW (ref >60)
Glucose, Bld: 94 mg/dL (ref 70–99)
Potassium: 5.3 mmol/L — ABNORMAL HIGH (ref 3.5–5.1)
Sodium: 143 mmol/L (ref 135–145)
Total Bilirubin: 0.7 mg/dL (ref <1.2)
Total Protein: 8 g/dL (ref 6.5–8.1)

## 2023-07-11 LAB — CBC WITH DIFFERENTIAL (CANCER CENTER ONLY)
Abs Immature Granulocytes: 0.01 10*3/uL (ref 0.00–0.07)
Basophils Absolute: 0.1 10*3/uL (ref 0.0–0.1)
Basophils Relative: 1 %
Eosinophils Absolute: 0.2 10*3/uL (ref 0.0–0.5)
Eosinophils Relative: 3 %
HCT: 41.6 % (ref 39.0–52.0)
Hemoglobin: 13.5 g/dL (ref 13.0–17.0)
Immature Granulocytes: 0 %
Lymphocytes Relative: 23 %
Lymphs Abs: 1.2 10*3/uL (ref 0.7–4.0)
MCH: 29.8 pg (ref 26.0–34.0)
MCHC: 32.5 g/dL (ref 30.0–36.0)
MCV: 91.8 fL (ref 80.0–100.0)
Monocytes Absolute: 0.6 10*3/uL (ref 0.1–1.0)
Monocytes Relative: 11 %
Neutro Abs: 3.2 10*3/uL (ref 1.7–7.7)
Neutrophils Relative %: 62 %
Platelet Count: 167 10*3/uL (ref 150–400)
RBC: 4.53 MIL/uL (ref 4.22–5.81)
RDW: 13.5 % (ref 11.5–15.5)
WBC Count: 5.2 10*3/uL (ref 4.0–10.5)
nRBC: 0 % (ref 0.0–0.2)

## 2023-07-11 LAB — LACTATE DEHYDROGENASE: LDH: 175 U/L (ref 98–192)

## 2023-07-11 NOTE — Progress Notes (Signed)
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

## 2023-07-11 NOTE — Progress Notes (Signed)
Hematology and Oncology Follow Up Visit  Michael Knox [id number] [birth year] 77 y.o. 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  He has decided on treatment for his renal cell carcinoma.  I think that we can go ahead with immunotherapy on him.  I think this would be reasonable.  I think that single agent pembrolizumab would be a reasonable choice for him.  He had a MRI of the brain 3 weeks ago.  Unfortunate, we still do not have the results.  He does complain of some pain.  This is where he has a hernia for he had the right kidney removed.  He has had no cough or shortness of breath.  He has had no headache.  He has had no bleeding.  There is been no change in bowel or bladder habits.  He has had no leg swelling.  He has had no nausea or vomiting.  Overall, I would have to say that his performance status is probably ECOG 1.   Medications:  Current Outpatient Medications:    amLODipine (NORVASC) 10 MG tablet, Take 10 mg by mouth daily., Disp: , Rfl:    aspirin 81 MG EC tablet, Take 1 tablet by mouth daily., Disp: , Rfl:    Cholecalciferol 25 MCG (1000 UT) capsule, Take by mouth daily., Disp: , Rfl:    famotidine (PEPCID) 20 MG tablet, Take by mouth daily., Disp: , Rfl:    Garlic 1000 MG CAPS, Take by mouth daily., Disp: , Rfl:    Omega-3 1000 MG CAPS, Take 1 capsule by mouth daily., Disp: , Rfl:    promethazine (PHENERGAN) 25 MG tablet, Take 1 tablet (25 mg total) by mouth every 6 (six) hours as needed for nausea or vomiting. (Patient not taking: Reported on 02/24/2023), Disp: 30 tablet, Rfl: 0   rosuvastatin (CRESTOR) 20 MG tablet, TAKE 1 TABLET BY MOUTH EVERY DAY FOR CHOLESTEROL, Disp: , Rfl:    telmisartan (MICARDIS) 80 MG tablet, TAKE ONE TABLET  BY MOUTH EACH DAY FOR BLOOD PRESSURE, Disp: , Rfl:   Allergies:  Allergies  Allergen Reactions   Atorvastatin Other (See Comments)    Joint pain     Pitavastatin Other (See Comments)    Headache, constipation    Simvastatin Other (See Comments)    Joint pain      Past Medical History, Surgical history, Social history, and Family History were reviewed and updated.  Review of Systems: Review of Systems  Constitutional: Negative.   HENT:  Negative.    Eyes: Negative.   Respiratory: Negative.    Cardiovascular: Negative.   Gastrointestinal: Negative.   Endocrine: Negative.   Genitourinary: Negative.    Musculoskeletal: Negative.   Skin: Negative.   Neurological: Negative.   Hematological: Negative.   Psychiatric/Behavioral: Negative.      Physical Exam:  weight is 186 lb (84.4 kg). His oral temperature is 98.3 F (36.8 C). His blood pressure is 132/53 (abnormal) and his pulse is 58 (abnormal). His respiration is 16 and oxygen saturation is 97%.   Wt Readings from Last 3 Encounters:  07/11/23 186 lb (84.4 kg)  06/14/23 186 lb (84.4 kg)  05/26/23 186 lb (84.4 kg)    Physical Exam Vitals reviewed.  HENT:     Head: Normocephalic and  atraumatic.  Eyes:     Pupils: Pupils are equal, round, and reactive to light.  Cardiovascular:     Rate and Rhythm: Normal rate and regular rhythm.     Heart sounds: Normal heart sounds.  Pulmonary:     Effort: Pulmonary effort is normal.     Breath sounds: Normal breath sounds.  Abdominal:     General: Bowel sounds are normal.     Palpations: Abdomen is soft.  Musculoskeletal:        General: No tenderness or deformity. Normal range of motion.     Cervical back: Normal range of motion.  Lymphadenopathy:     Cervical: No cervical adenopathy.  Skin:    General: Skin is warm and dry.     Findings: No erythema or rash.  Neurological:     Mental Status: He is alert and oriented to person, place, and time.  Psychiatric:         Behavior: Behavior normal.        Thought Content: Thought content normal.        Judgment: Judgment normal.      Lab Results  Component Value Date   WBC 5.2 07/11/2023   HGB 13.5 07/11/2023   HCT 41.6 07/11/2023   MCV 91.8 07/11/2023   PLT 167 07/11/2023     Chemistry      Component Value Date/Time   NA 143 07/11/2023 1506   K 5.3 (H) 07/11/2023 1506   CL 105 07/11/2023 1506   CO2 29 07/11/2023 1506   BUN 26 (H) 07/11/2023 1506   CREATININE 1.98 (H) 07/11/2023 1506      Component Value Date/Time   CALCIUM 10.2 07/11/2023 1506   ALKPHOS 95 07/11/2023 1506   AST 16 07/11/2023 1506   ALT 14 07/11/2023 1506   BILITOT 0.7 07/11/2023 1506     Impression and Plan: Michael Knox is a very nice 77 year old white male.  He has a history of recurrent clear-cell carcinoma of the kidney.  He had a early stage kidney cancer of the right kidney back in 2013.  He had a partial nephrectomy.  He subsequently had a recurrence.  This was treated with radiation therapy.   He now has progressive disease.  He is decided on treatment for this.  I think immunotherapy would be a very reasonable way to go.  Again we will put him on pembrolizumab which is every 3 weeks.  If everything looks good on pembrolizumab, then we can move him to every 6 weeks.  We will have to see what the MRI of the brain shows.  For right now, we will try to get started next week.  I think that we should be able to do this.  I would see him back when he starts his second cycle of pembrolizumab.  I would go ahead and give him 4 cycles and then repeat his CT scan to see how everything looks.    [name], MD 11/11/20244:40 PM

## 2023-07-14 ENCOUNTER — Other Ambulatory Visit: Payer: Self-pay

## 2023-07-14 NOTE — Progress Notes (Addendum)
Pharmacist Chemotherapy Monitoring - Initial Assessment    Anticipated start date: 07/18/23   The following has been reviewed per standard work regarding the patient's treatment regimen: The patient's diagnosis, treatment plan and drug doses, and organ/hematologic function Lab orders and baseline tests specific to treatment regimen  The treatment plan start date, drug sequencing, and pre-medications Prior authorization status  Patient's documented medication list, including drug-drug interaction screen and prescriptions for anti-emetics and supportive care specific to the treatment regimen The drug concentrations, fluid compatibility, administration routes, and timing of the medications to be used The patient's access for treatment and lifetime cumulative dose history, if applicable  The patient's medication allergies and previous infusion related reactions, if applicable   Changes made to treatment plan:  N/A  Follow up needed:  1) FU TSH   Richardean Sale, RPH, BCPS, BCOP 07/14/2023  2:43 PM

## 2023-07-18 ENCOUNTER — Inpatient Hospital Stay: Payer: Medicare Other

## 2023-07-18 ENCOUNTER — Other Ambulatory Visit: Payer: Self-pay | Admitting: *Deleted

## 2023-07-18 ENCOUNTER — Inpatient Hospital Stay (HOSPITAL_BASED_OUTPATIENT_CLINIC_OR_DEPARTMENT_OTHER): Payer: Medicare Other

## 2023-07-18 VITALS — BP 111/46 | HR 52 | Temp 97.6°F | Resp 20

## 2023-07-18 DIAGNOSIS — C649 Malignant neoplasm of unspecified kidney, except renal pelvis: Secondary | ICD-10-CM

## 2023-07-18 DIAGNOSIS — Z5112 Encounter for antineoplastic immunotherapy: Secondary | ICD-10-CM | POA: Diagnosis not present

## 2023-07-18 LAB — CBC WITH DIFFERENTIAL (CANCER CENTER ONLY)
Abs Immature Granulocytes: 0.02 10*3/uL (ref 0.00–0.07)
Basophils Absolute: 0.1 10*3/uL (ref 0.0–0.1)
Basophils Relative: 1 %
Eosinophils Absolute: 0.1 10*3/uL (ref 0.0–0.5)
Eosinophils Relative: 2 %
HCT: 39.3 % (ref 39.0–52.0)
Hemoglobin: 12.9 g/dL — ABNORMAL LOW (ref 13.0–17.0)
Immature Granulocytes: 0 %
Lymphocytes Relative: 17 %
Lymphs Abs: 1 10*3/uL (ref 0.7–4.0)
MCH: 30 pg (ref 26.0–34.0)
MCHC: 32.8 g/dL (ref 30.0–36.0)
MCV: 91.4 fL (ref 80.0–100.0)
Monocytes Absolute: 0.5 10*3/uL (ref 0.1–1.0)
Monocytes Relative: 10 %
Neutro Abs: 3.9 10*3/uL (ref 1.7–7.7)
Neutrophils Relative %: 70 %
Platelet Count: 148 10*3/uL — ABNORMAL LOW (ref 150–400)
RBC: 4.3 MIL/uL (ref 4.22–5.81)
RDW: 13.5 % (ref 11.5–15.5)
WBC Count: 5.6 10*3/uL (ref 4.0–10.5)
nRBC: 0 % (ref 0.0–0.2)

## 2023-07-18 LAB — CMP (CANCER CENTER ONLY)
ALT: 15 U/L (ref 0–44)
AST: 16 U/L (ref 15–41)
Albumin: 4.9 g/dL (ref 3.5–5.0)
Alkaline Phosphatase: 87 U/L (ref 38–126)
Anion gap: 8 (ref 5–15)
BUN: 31 mg/dL — ABNORMAL HIGH (ref 8–23)
CO2: 26 mmol/L (ref 22–32)
Calcium: 9.8 mg/dL (ref 8.9–10.3)
Chloride: 106 mmol/L (ref 98–111)
Creatinine: 1.83 mg/dL — ABNORMAL HIGH (ref 0.61–1.24)
GFR, Estimated: 37 mL/min — ABNORMAL LOW (ref >60)
Glucose, Bld: 105 mg/dL — ABNORMAL HIGH (ref 70–99)
Potassium: 4.9 mmol/L (ref 3.5–5.1)
Sodium: 140 mmol/L (ref 135–145)
Total Bilirubin: 0.6 mg/dL (ref <1.2)
Total Protein: 7.6 g/dL (ref 6.5–8.1)

## 2023-07-18 LAB — TSH: TSH: 1.404 u[IU]/mL (ref 0.350–4.500)

## 2023-07-18 MED ORDER — SODIUM CHLORIDE 0.9 % IV SOLN: INTRAVENOUS | Status: DC

## 2023-07-18 MED ORDER — PROCHLORPERAZINE MALEATE 10 MG PO TABS: 10.0000 mg | ORAL_TABLET | Freq: Four times a day (QID) | ORAL | 1 refills | Status: DC | PRN

## 2023-07-18 MED ORDER — ONDANSETRON HCL 8 MG PO TABS: 8.0000 mg | ORAL_TABLET | Freq: Three times a day (TID) | ORAL | 1 refills | Status: DC | PRN

## 2023-07-18 MED ORDER — SODIUM CHLORIDE 0.9 % IV SOLN
200.0000 mg | Freq: Once | INTRAVENOUS | Status: AC
  Administered 2023-07-18: 200 mg via INTRAVENOUS
  Filled 2023-07-18: qty 8

## 2023-07-18 NOTE — Patient Instructions (Addendum)
[facility] - A DEPT OF [facility]  Discharge Instructions: Thank you for choosing [facility] to provide your oncology and hematology care.   If you have a lab appointment with the Cancer Center, please go directly to the Cancer Center and check in at the registration area.  Wear comfortable clothing and clothing appropriate for easy access to any Portacath or PICC line.   We strive to give you quality time with your provider. You may need to reschedule your appointment if you arrive late (15 or more minutes).  Arriving late affects you and other patients whose appointments are after yours.  Also, if you miss three or more appointments without notifying the office, you may be dismissed from the clinic at the provider's discretion.      For prescription refill requests, have your pharmacy contact our office and allow 72 hours for refills to be completed.    Today you received the following chemotherapy and/or immunotherapy agents:  Keytruda      To help prevent nausea and vomiting after your treatment, we encourage you to take your nausea medication as directed.  BELOW ARE SYMPTOMS THAT SHOULD BE REPORTED IMMEDIATELY: *FEVER GREATER THAN 100.4 F (38 C) OR HIGHER *CHILLS OR SWEATING *NAUSEA AND VOMITING THAT IS NOT CONTROLLED WITH YOUR NAUSEA MEDICATION *UNUSUAL SHORTNESS OF BREATH *UNUSUAL BRUISING OR BLEEDING *URINARY PROBLEMS (pain or burning when urinating, or frequent urination) *BOWEL PROBLEMS (unusual diarrhea, constipation, pain near the anus) TENDERNESS IN MOUTH AND THROAT WITH OR WITHOUT PRESENCE OF ULCERS (sore throat, sores in mouth, or a toothache) UNUSUAL RASH, SWELLING OR PAIN  UNUSUAL VAGINAL DISCHARGE OR ITCHING   Items with * indicate a potential emergency and should be followed up as soon as possible or go to the Emergency Department if any problems should occur.  Please show the CHEMOTHERAPY ALERT CARD or IMMUNOTHERAPY  ALERT CARD at check-in to the Emergency Department and triage nurse. Should you have questions after your visit or need to cancel or reschedule your appointment, please contact [facility] - A DEPT OF [facility]  [phone] and follow the prompts.  Office hours are 8:00 a.m. to 4:30 p.m. Monday - Friday. Please note that voicemails left after 4:00 p.m. may not be returned until the following business day.  We are closed weekends and major holidays. You have access to a nurse at all times for urgent questions. Please call the main number to the clinic [phone] and follow the prompts.  For any non-urgent questions, you may also contact your provider using MyChart. We now offer e-Visits for anyone 43 and older to request care online for non-urgent symptoms. For details visit [email or web address].   Also download the MyChart app! Go to the app store, search "MyChart", open the app, select [facility], and log in with your MyChart username and password.  Pembrolizumab Injection What is this medication? PEMBROLIZUMAB (PEM broe LIZ ue mab) treats some types of cancer. It works by helping your immune system slow or stop the spread of cancer cells. It is a monoclonal antibody. This medicine may be used for other purposes; ask your health care provider or pharmacist if you have questions. COMMON BRAND NAME(S): Keytruda What should I tell my care team before I take this medication? They need to know if you have any of these conditions: Allogeneic stem cell transplant (uses someone else's stem cells) Autoimmune diseases, such as Crohn disease, ulcerative colitis, lupus  History of chest radiation Nervous system problems, such as Guillain-Barre syndrome, myasthenia gravis Organ transplant An unusual or allergic reaction to pembrolizumab, other medications, foods, dyes, or preservatives Pregnant or trying to get pregnant Breast-feeding How should I use this  medication? This medication is injected into a vein. It is given by your care team in a hospital or clinic setting. A special MedGuide will be given to you before each treatment. Be sure to read this information carefully each time. Talk to your care team about the use of this medication in children. While it may be prescribed for children as young as 6 months for selected conditions, precautions do apply. Overdosage: If you think you have taken too much of this medicine contact a poison control center or emergency room at once. NOTE: This medicine is only for you. Do not share this medicine with others. What if I miss a dose? Keep appointments for follow-up doses. It is important not to miss your dose. Call your care team if you are unable to keep an appointment. What may interact with this medication? Interactions have not been studied. This list may not describe all possible interactions. Give your health care provider a list of all the medicines, herbs, non-prescription drugs, or dietary supplements you use. Also tell them if you smoke, drink alcohol, or use illegal drugs. Some items may interact with your medicine. What should I watch for while using this medication? Your condition will be monitored carefully while you are receiving this medication. You may need blood work while taking this medication. This medication may cause serious skin reactions. They can happen weeks to months after starting the medication. Contact your care team right away if you notice fevers or flu-like symptoms with a rash. The rash may be red or purple and then turn into blisters or peeling of the skin. You may also notice a red rash with swelling of the face, lips, or lymph nodes in your neck or under your arms. Tell your care team right away if you have any change in your eyesight. Talk to your care team if you may be pregnant. Serious birth defects can occur if you take this medication during pregnancy and for 4  months after the last dose. You will need a negative pregnancy test before starting this medication. Contraception is recommended while taking this medication and for 4 months after the last dose. Your care team can help you find the option that works for you. Do not breastfeed while taking this medication and for 4 months after the last dose. What side effects may I notice from receiving this medication? Side effects that you should report to your care team as soon as possible: Allergic reactions--skin rash, itching, hives, swelling of the face, lips, tongue, or throat Dry cough, shortness of breath or trouble breathing Eye pain, redness, irritation, or discharge with blurry or decreased vision Heart muscle inflammation--unusual weakness or fatigue, shortness of breath, chest pain, fast or irregular heartbeat, dizziness, swelling of the ankles, feet, or hands Hormone gland problems--headache, sensitivity to light, unusual weakness or fatigue, dizziness, fast or irregular heartbeat, increased sensitivity to cold or heat, excessive sweating, constipation, hair loss, increased thirst or amount of urine, tremors or shaking, irritability Infusion reactions--chest pain, shortness of breath or trouble breathing, feeling faint or lightheaded Kidney injury (glomerulonephritis)--decrease in the amount of urine, red or dark brown urine, foamy or bubbly urine, swelling of the ankles, hands, or feet Liver injury--right upper belly pain, loss of appetite, nausea,  light-colored stool, dark yellow or brown urine, yellowing skin or eyes, unusual weakness or fatigue Pain, tingling, or numbness in the hands or feet, muscle weakness, change in vision, confusion or trouble speaking, loss of balance or coordination, trouble walking, seizures Rash, fever, and swollen lymph nodes Redness, blistering, peeling, or loosening of the skin, including inside the mouth Sudden or severe stomach pain, bloody diarrhea, fever, nausea,  vomiting Side effects that usually do not require medical attention (report to your care team if they continue or are bothersome): Bone, joint, or muscle pain Diarrhea Fatigue Loss of appetite Nausea Skin rash This list may not describe all possible side effects. Call your doctor for medical advice about side effects. You may report side effects to FDA at 1-800-FDA-1088. Where should I keep my medication? This medication is given in a hospital or clinic. It will not be stored at home. NOTE: This sheet is a summary. It may not cover all possible information. If you have questions about this medicine, talk to your doctor, pharmacist, or health care provider.  2024 Elsevier/Gold Standard (2021-12-29 00:00:00)

## 2023-07-18 NOTE — Progress Notes (Signed)
OK to treat with BUN-31 and Creat-1.83 per order of Dr. Myna Hidalgo.

## 2023-07-19 LAB — T4: T4, Total: 8.3 ug/dL (ref 4.5–12.0)

## 2023-07-27 ENCOUNTER — Other Ambulatory Visit: Payer: Self-pay | Admitting: Radiology

## 2023-07-29 NOTE — H&P (Signed)
Chief Complaint: Patient was seen in consultation today for Port-A-Cath placement due to poor venous access in the setting of immunotherapy treatment for recurrent and metastatic RCC, at the request of Dr. [name]  Supervising Physician: Irish Lack  Patient Status: Michael Knox  History of Present Illness: Michael Knox is a 77 y.o. male    FULL Code status per patient.  Michael Knox  has a history of right RCC in 2013 with partial nephrectomy, with recurrence treated with radiation. Restaging CT C/A/P wo on 06/01/2023 concerning for pulmonary metastasis.  He has enlarging pulmonary nodules bilaterally.  Retroperitoneal lymph node which is stable at 5.5 x 4.4 cm.   Patient has opted for immunotherapy treatment with Pembrolizumab, which he initiated on 11/18. Unfortunately, he is noted with poor venous access. Dr. Myna Hidalgo has therefore requested IR to place a port for immunotherapy infusions. Patient is scheduled in IR today for same.    No past medical history on file.  No past surgical history on file.  Allergies: Atorvastatin, Pitavastatin, and Simvastatin  Medications: Prior to Admission medications   Medication Sig Start Date End Date Taking? Authorizing Provider  amLODipine (NORVASC) 10 MG tablet Take 10 mg by mouth daily. 11/07/21   [provider]  aspirin 81 MG EC tablet Take 1 tablet by mouth daily.    [provider]  Cholecalciferol 25 MCG (1000 UT) capsule Take by mouth daily. 12/22/20   [provider]  famotidine (PEPCID) 20 MG tablet Take by mouth daily.    [provider]  Garlic 1000 MG CAPS Take by mouth daily.    [provider]  Omega-3 1000 MG CAPS Take 1 capsule by mouth daily.    [provider]  ondansetron (ZOFRAN) 8 MG tablet Take 1 tablet (8 mg total) by mouth every 8 (eight) hours as needed for nausea or vomiting. 07/18/23   [name], MD  prochlorperazine (COMPAZINE) 10 MG  tablet Take 1 tablet (10 mg total) by mouth every 6 (six) hours as needed for nausea or vomiting. 07/18/23   [name], MD  promethazine (PHENERGAN) 25 MG tablet Take 1 tablet (25 mg total) by mouth every 6 (six) hours as needed for nausea or vomiting. Patient not taking: Reported on 02/24/2023 01/29/22   Dorothy Puffer, MD  rosuvastatin (CRESTOR) 20 MG tablet TAKE 1 TABLET BY MOUTH EVERY DAY FOR CHOLESTEROL 12/03/21   [provider]  telmisartan (MICARDIS) 80 MG tablet TAKE ONE TABLET BY MOUTH EACH DAY FOR BLOOD PRESSURE 11/25/21   [provider]     No family history on file.  Social History   Socioeconomic History   Marital status: Married    Spouse name: Not on file   Number of children: Not on file   Years of education: Not on file   Highest education level: Not on file  Occupational History   Not on file  Tobacco Use   Smoking status: Former    Current packs/day: 0.00    Average packs/day: 1 pack/day for 30.0 years (30.0 ttl pk-yrs)    Types: Cigarettes    Start date: 62    Quit date: 2017    Years since quitting: 7.9   Smokeless tobacco: Never  Vaping Use   Vaping status: Never Used  Substance and Sexual Activity   Alcohol use: Not Currently   Drug use: Not Currently   Sexual activity: Not Currently  Other Topics Concern   Not on file  Social History Narrative   Not on file   Social Determinants of Health   Financial Resource Strain: High Risk (11/12/2021)   Received from [facility], [facility] visits prior to 10/30/2022., [facility], [facility] visits prior to 10/30/2022.   Overall Financial Resource Strain (CARDIA)    Difficulty of Paying Living Expenses: Hard  Food Insecurity: No Food Insecurity (11/17/2022)   Hunger Vital Sign    Worried About Running Out of Food in the Last Year: Never true    Ran Out of Food in the Last Year: Never true  Transportation Needs: No Transportation Needs  (11/17/2022)   PRAPARE - Administrator, Civil Service (Medical): No    Lack of Transportation (Non-Medical): No  Physical Activity: Not on file  Stress: Not on file  Social Connections: Not on file      Review of Systems currently denies fever,HA,dyspnea, cough, N/V or bleeding; he does have some rt lateral lower chest/upper abd discomfort and is HOH  Vital Signs: Vitals:   08/01/23 0920  BP: 136/68  Pulse: 67  Resp: 20  Temp: 97.8 F (36.6 C)  SpO2: 100%     Advance Care Plan: no documents on file    Physical Exam awake/alert; chest- CTA bilat; heart- RRR, soft murmur; abd-soft,+BS,NT; no sig LE edema  Imaging: No results found.  Labs:  CBC: Recent Labs    02/24/23 1453 05/26/23 1452 07/11/23 1506 07/18/23 0941  WBC 4.9 4.9 5.2 5.6  HGB 12.6* 13.5 13.5 12.9*  HCT 39.1 41.5 41.6 39.3  PLT 138* 151 167 148*    COAGS: No results for input(s): "INR", "APTT" in the last 8760 hours.  BMP: Recent Labs    02/24/23 1453 05/26/23 1452 07/11/23 1506 07/18/23 0941  NA 142 144 143 140  K 4.5 5.0 5.3* 4.9  CL 107 107 105 106  CO2 26 27 29 26   GLUCOSE 95 139* 94 105*  BUN 25* 26* 26* 31*  CALCIUM 9.8 9.5 10.2 9.8  CREATININE 1.76* 2.00* 1.98* 1.83*  GFRNONAA 39* 33* 34* 37*    LIVER FUNCTION TESTS: Recent Labs    02/24/23 1453 05/26/23 1452 07/11/23 1506 07/18/23 0941  BILITOT 0.6 0.6 0.7 0.6  AST 17 17 16 16   ALT 19 16 14 15   ALKPHOS 77 84 95 87  PROT 8.1 7.5 8.0 7.6  ALBUMIN 4.8 4.6 4.8 4.9    TUMOR MARKERS: No results for input(s): "AFPTM", "CEA", "CA199", "CHROMGRNA" in the last 8760 hours.  Assessment and Plan:  Michael Knox  has a history of right RCC in 2013 with partial nephrectomy, with recurrence treated with radiation. Restaging CT C/A/P wo on 06/01/2023 concerning for metastasis. Patient has opted for immunotherapy treatment, which he initiated on 11/18. Unfortunately, he is noted with poor venous access. Dr. Myna Hidalgo has  therefore requested IR to place a port for immunotherapy infusions. Patient presents in IR today for same.  Risks and benefits of image guided port-a-catheter placement was discussed with the patient/spouse/daughter including, but not limited to bleeding, infection, pneumothorax, or fibrin sheath development and need for additional procedures.  All of the patient's questions were answered, patient is agreeable to proceed. Consent signed and in chart.   Thank you for this interesting consult.  I greatly enjoyed meeting Michael Knox and look forward to participating in their care.  A copy of this report was sent to the requesting provider on this date.  Electronically Signed:  Charles A Carim, PA-C/Kevin Maddox Hlavaty,PA-C 07/29/2023, 12:06 PM   I spent a total of  25 minutes   in face to face in clinical consultation, greater than 50% of which was counseling/coordinating care for Port-A-Cath placement.

## 2023-08-01 ENCOUNTER — Other Ambulatory Visit: Payer: Self-pay

## 2023-08-01 ENCOUNTER — Ambulatory Visit (HOSPITAL_COMMUNITY)
Admission: RE | Admit: 2023-08-01 | Discharge: 2023-08-01 | Disposition: A | Discharge status: Home / Self Care | Payer: Medicare Other | Source: Ambulatory Visit | Attending: Hematology & Oncology | Admitting: Hematology & Oncology

## 2023-08-01 ENCOUNTER — Encounter (HOSPITAL_COMMUNITY): Payer: Self-pay

## 2023-08-01 DIAGNOSIS — C649 Malignant neoplasm of unspecified kidney, except renal pelvis: Secondary | ICD-10-CM | POA: Diagnosis present

## 2023-08-01 DIAGNOSIS — C7989 Secondary malignant neoplasm of other specified sites: Secondary | ICD-10-CM | POA: Insufficient documentation

## 2023-08-01 DIAGNOSIS — R918 Other nonspecific abnormal finding of lung field: Secondary | ICD-10-CM | POA: Diagnosis not present

## 2023-08-01 DIAGNOSIS — Z87891 Personal history of nicotine dependence: Secondary | ICD-10-CM | POA: Diagnosis not present

## 2023-08-01 DIAGNOSIS — Z923 Personal history of irradiation: Secondary | ICD-10-CM | POA: Diagnosis not present

## 2023-08-01 HISTORY — PX: IR IMAGING GUIDED PORT INSERTION: IMG5740

## 2023-08-01 MED ORDER — SODIUM CHLORIDE 0.9% FLUSH: 3.0000 mL | Freq: Two times a day (BID) | INTRAVENOUS | Status: DC

## 2023-08-01 MED ORDER — LIDOCAINE HCL 1 % IJ SOLN
20.0000 mL | Freq: Once | INTRAMUSCULAR | Status: AC
  Administered 2023-08-01: 18 mL via INTRADERMAL

## 2023-08-01 MED ORDER — FENTANYL CITRATE (PF) 100 MCG/2ML IJ SOLN
INTRAMUSCULAR | Status: AC
  Filled 2023-08-01: qty 2

## 2023-08-01 MED ORDER — FENTANYL CITRATE (PF) 100 MCG/2ML IJ SOLN
INTRAMUSCULAR | Status: AC | PRN
  Administered 2023-08-01 (×2): 50 ug via INTRAVENOUS

## 2023-08-01 MED ORDER — SODIUM CHLORIDE 0.9 % IV SOLN: INTRAVENOUS | Status: DC

## 2023-08-01 MED ORDER — MIDAZOLAM HCL 2 MG/2ML IJ SOLN
INTRAMUSCULAR | Status: AC
  Filled 2023-08-01: qty 4

## 2023-08-01 MED ORDER — HEPARIN SOD (PORK) LOCK FLUSH 100 UNIT/ML IV SOLN
500.0000 [IU] | Freq: Once | INTRAVENOUS | Status: AC
  Administered 2023-08-01: 500 [IU]

## 2023-08-01 MED ORDER — HEPARIN SOD (PORK) LOCK FLUSH 100 UNIT/ML IV SOLN
INTRAVENOUS | Status: AC | PRN
  Administered 2023-08-01: 500 [IU] via INTRAVENOUS

## 2023-08-01 MED ORDER — MIDAZOLAM HCL 2 MG/2ML IJ SOLN
INTRAMUSCULAR | Status: AC | PRN
  Administered 2023-08-01 (×2): 1 mg via INTRAVENOUS

## 2023-08-01 MED ORDER — HEPARIN SOD (PORK) LOCK FLUSH 100 UNIT/ML IV SOLN
INTRAVENOUS | Status: AC
  Filled 2023-08-01: qty 5

## 2023-08-01 MED ORDER — LIDOCAINE HCL 1 % IJ SOLN
INTRAMUSCULAR | Status: AC
  Filled 2023-08-01: qty 20

## 2023-08-01 NOTE — Discharge Instructions (Signed)
For questions /concerns may call Interventional Radiology at [phone] or  Interventional Radiology clinic [phone]   You may remove your dressing and shower tomorrow afternoon  DO NOT use EMLA cream for 2 weeks after port placement as the cream will remove surgical glue on your incision.                                                                                         Implanted Port Insertion, Care After  The following information offers guidance on how to care for yourself after your procedure. Your health care provider may also give you more specific instructions. If you have problems or questions, contact your health care provider. What can I expect after the procedure? After the procedure, it is common to have: Discomfort at the port insertion site. Bruising on the skin over the port. This should improve over 3-4 days. Follow these instructions at home: [facility] care After your port is placed, you will get a manufacturer's information card. The card has information about your port. Keep this card with you at all times. Take care of the port as told by your health care provider. Ask your health care provider if you or a family member can get training for taking care of the port at home. A home health care nurse will be be available to help care for the port. Make sure to remember what type of port you have. Incision care  Follow instructions from your health care provider about how to take care of your port insertion site. Make sure you: Wash your hands with soap and water for at least 20 seconds before and after you change your bandage (dressing). If soap and water are not available, use hand sanitizer. Change your dressing as told by your health care provider. Leave stitches (sutures), skin glue, or adhesive strips in place. These skin closures may need to stay in place for 2 weeks or longer. If adhesive strip edges start to loosen and curl up, you may trim the loose edges. Do  not remove adhesive strips completely unless your health care provider tells you to do that. Check your port insertion site every day for signs of infection. Check for: Redness, swelling, or pain. Fluid or blood. Warmth. Pus or a bad smell. Activity Return to your normal activities as told by your health care provider. Ask your health care provider what activities are safe for you. You may have to avoid lifting. Ask your health care provider how much you can safely lift. General instructions Take over-the-counter and prescription medicines only as told by your health care provider. Do not take baths, swim, or use a hot tub until your health care provider approves. Ask your health care provider if you may take showers. You may only be allowed to take sponge baths. If you were given a sedative during the procedure, it can affect you for several hours. Do not drive or operate machinery until your health care provider says that it is safe. Wear a medical alert bracelet in case of an emergency. This will tell any health care providers that you have a port. Keep all follow-up visits.  This is important. Contact a health care provider if: You cannot flush your port with saline as directed, or you cannot draw blood from the port. You have a fever or chills. You have redness, swelling, or pain around your port insertion site. You have fluid or blood coming from your port insertion site. Your port insertion site feels warm to the touch. You have pus or a bad smell coming from the port insertion site. Get help right away if: You have chest pain or shortness of breath. You have bleeding from your port that you cannot control. These symptoms may be an emergency. Get help right away. Call 911. Do not wait to see if the symptoms will go away. Do not drive yourself to the hospital. Summary Take care of the port as told by your health care provider. Keep the manufacturer's information card with you at all  times. Change your dressing as told by your health care provider. Contact a health care provider if you have a fever or chills or if you have redness, swelling, or pain around your port insertion site. Keep all follow-up visits. This information is not intended to replace advice given to you by your health care provider. Make sure you discuss any questions you have with your health care provider.                                                                                              Moderate Conscious Sedation, Adult, Care After After the procedure, it is common to have: Sleepiness for a few hours. Impaired judgment for a few hours. Trouble with balance. Nausea or vomiting if you eat too soon. Follow these instructions at home: For the time period you were told by your health care provider:  Rest. Do not participate in activities where you could fall or become injured. Do not drive or use machinery. Do not drink alcohol. Do not take sleeping pills or medicines that cause drowsiness. Do not make important decisions or sign legal documents. Do not take care of children on your own. Eating and drinking Follow instructions from your health care provider about what you may eat and drink. Drink enough fluid to keep your urine pale yellow. If you vomit: Drink clear fluids slowly and in small amounts as you are able. Clear fluids include water, ice chips, low-calorie sports drinks, and fruit juice that has water added to it (diluted fruit juice). Eat light and bland foods in small amounts as you are able. These foods include bananas, applesauce, rice, lean meats, toast, and crackers. General instructions Take over-the-counter and prescription medicines only as told by your health care provider. Have a responsible adult stay with you for the time you are told. Do not use any products that contain nicotine or tobacco. These products include cigarettes, chewing tobacco, and vaping devices,  such as e-cigarettes. If you need help quitting, ask your health care provider. Return to your normal activities as told by your health care provider. Ask your health care provider what activities are safe for you. Your health care provider may give you more instructions. Make sure you  know what you can and cannot do. Contact a health care provider if: You are still sleepy or having trouble with balance after 24 hours. You feel light-headed. You vomit every time you eat or drink. You get a rash. You have a fever. You have redness or swelling around the IV site. Get help right away if: You have trouble breathing. You start to feel confused at home. These symptoms may be an emergency. Get help right away. Call 911. Do not wait to see if the symptoms will go away. Do not drive yourself to the hospital. This information is not intended to replace advice given to you by your health care provider. Make sure you discuss any questions you have with your health care provider. Document Revised: 03/01/2022 Document Reviewed: 03/01/2022 Elsevier Patient Education  2024 [employer].

## 2023-08-01 NOTE — Procedures (Signed)
Interventional Radiology Procedure Note  Procedure: Single Lumen Power Port Placement    Access:  Right IJ vein.  Findings: Catheter tip positioned at SVC/RA junction. Port is ready for immediate use.   Complications: None  EBL: < 10 mL  Recommendations:  - Ok to shower in 24 hours - Do not submerge for 7 days - Routine line care   Lashauna Arpin T. Jacobs Golab, M.D Pager:  [phone]   

## 2023-08-08 ENCOUNTER — Inpatient Hospital Stay: Payer: Medicare Other | Attending: Hematology & Oncology | Admitting: Hematology & Oncology

## 2023-08-08 ENCOUNTER — Inpatient Hospital Stay: Payer: Medicare Other

## 2023-08-08 ENCOUNTER — Other Ambulatory Visit: Payer: Self-pay

## 2023-08-08 ENCOUNTER — Encounter: Payer: Self-pay | Admitting: Hematology & Oncology

## 2023-08-08 ENCOUNTER — Other Ambulatory Visit: Payer: Self-pay | Admitting: *Deleted

## 2023-08-08 VITALS — BP 140/61 | HR 65 | Temp 98.0°F | Resp 19 | Ht 67.0 in | Wt 184.0 lb

## 2023-08-08 DIAGNOSIS — C7801 Secondary malignant neoplasm of right lung: Secondary | ICD-10-CM | POA: Diagnosis present

## 2023-08-08 DIAGNOSIS — C649 Malignant neoplasm of unspecified kidney, except renal pelvis: Secondary | ICD-10-CM

## 2023-08-08 DIAGNOSIS — Z79899 Other long term (current) drug therapy: Secondary | ICD-10-CM | POA: Diagnosis not present

## 2023-08-08 DIAGNOSIS — Z5112 Encounter for antineoplastic immunotherapy: Secondary | ICD-10-CM | POA: Insufficient documentation

## 2023-08-08 DIAGNOSIS — C641 Malignant neoplasm of right kidney, except renal pelvis: Secondary | ICD-10-CM | POA: Insufficient documentation

## 2023-08-08 DIAGNOSIS — C786 Secondary malignant neoplasm of retroperitoneum and peritoneum: Secondary | ICD-10-CM | POA: Diagnosis not present

## 2023-08-08 DIAGNOSIS — C7989 Secondary malignant neoplasm of other specified sites: Secondary | ICD-10-CM | POA: Diagnosis not present

## 2023-08-08 LAB — CMP (CANCER CENTER ONLY)
ALT: 13 U/L (ref 0–44)
AST: 16 U/L (ref 15–41)
Albumin: 4.2 g/dL (ref 3.5–5.0)
Alkaline Phosphatase: 84 U/L (ref 38–126)
Anion gap: 8 (ref 5–15)
BUN: 26 mg/dL — ABNORMAL HIGH (ref 8–23)
CO2: 25 mmol/L (ref 22–32)
Calcium: 9.5 mg/dL (ref 8.9–10.3)
Chloride: 107 mmol/L (ref 98–111)
Creatinine: 1.74 mg/dL — ABNORMAL HIGH (ref 0.61–1.24)
GFR, Estimated: 39 mL/min — ABNORMAL LOW (ref >60)
Glucose, Bld: 108 mg/dL — ABNORMAL HIGH (ref 70–99)
Potassium: 4.2 mmol/L (ref 3.5–5.1)
Sodium: 140 mmol/L (ref 135–145)
Total Bilirubin: 0.6 mg/dL (ref <1.2)
Total Protein: 7.4 g/dL (ref 6.5–8.1)

## 2023-08-08 LAB — CBC WITH DIFFERENTIAL (CANCER CENTER ONLY)
Abs Immature Granulocytes: 0.01 10*3/uL (ref 0.00–0.07)
Basophils Absolute: 0.1 10*3/uL (ref 0.0–0.1)
Basophils Relative: 1 %
Eosinophils Absolute: 0.3 10*3/uL (ref 0.0–0.5)
Eosinophils Relative: 5 %
HCT: 37.1 % — ABNORMAL LOW (ref 39.0–52.0)
Hemoglobin: 12.2 g/dL — ABNORMAL LOW (ref 13.0–17.0)
Immature Granulocytes: 0 %
Lymphocytes Relative: 17 %
Lymphs Abs: 0.8 10*3/uL (ref 0.7–4.0)
MCH: 30 pg (ref 26.0–34.0)
MCHC: 32.9 g/dL (ref 30.0–36.0)
MCV: 91.2 fL (ref 80.0–100.0)
Monocytes Absolute: 0.5 10*3/uL (ref 0.1–1.0)
Monocytes Relative: 10 %
Neutro Abs: 3.4 10*3/uL (ref 1.7–7.7)
Neutrophils Relative %: 67 %
Platelet Count: 171 10*3/uL (ref 150–400)
RBC: 4.07 MIL/uL — ABNORMAL LOW (ref 4.22–5.81)
RDW: 13.4 % (ref 11.5–15.5)
WBC Count: 5.1 10*3/uL (ref 4.0–10.5)
nRBC: 0 % (ref 0.0–0.2)

## 2023-08-08 LAB — LACTATE DEHYDROGENASE: LDH: 165 U/L (ref 98–192)

## 2023-08-08 LAB — TSH: TSH: 1.763 u[IU]/mL (ref 0.350–4.500)

## 2023-08-08 MED ORDER — SODIUM CHLORIDE 0.9 % IV SOLN
200.0000 mg | Freq: Once | INTRAVENOUS | Status: AC
  Administered 2023-08-08: 200 mg via INTRAVENOUS
  Filled 2023-08-08: qty 8

## 2023-08-08 MED ORDER — SODIUM CHLORIDE 0.9 % IV SOLN: 40.0000 mg | Freq: Once | INTRAVENOUS | Status: DC

## 2023-08-08 MED ORDER — METHYLPREDNISOLONE SODIUM SUCC 125 MG IJ SOLR: 60.0000 mg | Freq: Once | INTRAMUSCULAR | Status: DC

## 2023-08-08 MED ORDER — FAMOTIDINE 200 MG/20ML IV SOLN
40.0000 mg | Freq: Once | INTRAVENOUS | Status: AC
  Administered 2023-08-08: 40 mg via INTRAVENOUS
  Filled 2023-08-08: qty 4

## 2023-08-08 MED ORDER — HYDROXYZINE HCL 25 MG PO TABS: 25.0000 mg | ORAL_TABLET | Freq: Four times a day (QID) | ORAL | 2 refills | Status: DC | PRN

## 2023-08-08 MED ORDER — SODIUM CHLORIDE 0.9 % IV SOLN: INTRAVENOUS | Status: DC

## 2023-08-08 MED ORDER — SODIUM CHLORIDE 0.9% FLUSH
10.0000 mL | INTRAVENOUS | Status: DC | PRN
  Administered 2023-08-08: 10 mL

## 2023-08-08 MED ORDER — METHYLPREDNISOLONE SODIUM SUCC 125 MG IJ SOLR
60.0000 mg | Freq: Once | INTRAMUSCULAR | Status: AC
  Administered 2023-08-08: 60 mg via INTRAVENOUS
  Filled 2023-08-08: qty 2

## 2023-08-08 MED ORDER — LIDOCAINE-PRILOCAINE 2.5-2.5 % EX CREA: 1.0000 | TOPICAL_CREAM | CUTANEOUS | 0 refills | Status: AC | PRN

## 2023-08-08 MED ORDER — METHYLPREDNISOLONE 4 MG PO TBPK: ORAL_TABLET | ORAL | 2 refills | Status: DC

## 2023-08-08 MED ORDER — HEPARIN SOD (PORK) LOCK FLUSH 100 UNIT/ML IV SOLN
500.0000 [IU] | Freq: Once | INTRAVENOUS | Status: AC | PRN
Start: 2023-08-08 — End: 2023-08-08
  Administered 2023-08-08: 500 [IU]

## 2023-08-08 NOTE — Progress Notes (Signed)
Per Dr. Myna Hidalgo, okay to treat today despite creatinine.

## 2023-08-08 NOTE — Patient Instructions (Signed)

## 2023-08-08 NOTE — Progress Notes (Signed)
Hematology and Oncology Follow Up Visit  IMON SALAMON [id number] [birth year] 77 y.o. 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 Pentz is in for follow-up.  His main problem now is pruritus.  He does not have a rash.  I suspect this probably is from the immunotherapy.  I will try some IV Pepcid and IV Solu-Medrol him.  At home, we will try him on some Medrol Dosepak and Atarax (25 mg p.o. every 6 hours as needed).    He does not have a rash.  He has had no fever.  He has had no diarrhea.  He has had no nausea or vomiting.  He has had no bleeding.  There has been a good appetite.  He has had no cough or chest wall pain.  There has been no leg swelling.  Overall, I would have to say that his performance status prior ECOG 1.   Medications:  Current Outpatient Medications:    amLODipine (NORVASC) 10 MG tablet, Take 10 mg by mouth daily., Disp: , Rfl:    aspirin 81 MG EC tablet, Take 1 tablet by mouth daily., Disp: , Rfl:    Cholecalciferol 25 MCG (1000 UT) capsule, Take by mouth daily., Disp: , Rfl:    famotidine (PEPCID) 20 MG tablet, Take by mouth daily., Disp: , Rfl:    Garlic 1000 MG CAPS, Take by mouth daily., Disp: , Rfl:    Omega-3 1000 MG CAPS, Take 1 capsule by mouth daily., Disp: , Rfl:    ondansetron (ZOFRAN) 8 MG tablet, Take 1 tablet (8 mg total) by mouth every 8 (eight) hours as needed for nausea or vomiting., Disp: 30 tablet, Rfl: 1   prochlorperazine (COMPAZINE) 10 MG tablet, Take 1 tablet (10 mg total) by mouth every 6 (six) hours as needed for nausea or vomiting., Disp: 30 tablet, Rfl: 1   promethazine (PHENERGAN) 25 MG tablet, Take 1 tablet (25 mg total) by mouth every 6 (six) hours as needed for nausea or vomiting. (Patient not taking: Reported on 02/24/2023),  Disp: 30 tablet, Rfl: 0   rosuvastatin (CRESTOR) 20 MG tablet, TAKE 1 TABLET BY MOUTH EVERY DAY FOR CHOLESTEROL, Disp: , Rfl:    telmisartan (MICARDIS) 80 MG tablet, TAKE ONE TABLET BY MOUTH EACH DAY FOR BLOOD PRESSURE, Disp: , Rfl:   Allergies:  Allergies  Allergen Reactions   Atorvastatin Other (See Comments)    Joint pain     Pitavastatin Other (See Comments)    Headache, constipation    Simvastatin Other (See Comments)    Joint pain      Past Medical History, Surgical history, Social history, and Family History were reviewed and updated.  Review of Systems: Review of Systems  Constitutional: Negative.   HENT:  Negative.    Eyes: Negative.   Respiratory: Negative.    Cardiovascular: Negative.   Gastrointestinal: Negative.   Endocrine: Negative.   Genitourinary: Negative.    Musculoskeletal: Negative.   Skin: Negative.   Neurological: Negative.   Hematological: Negative.   Psychiatric/Behavioral: Negative.      Physical Exam:  height is 5\' 7"  (1.702 m) and weight is 184 lb (83.5 kg). His oral temperature is 98 F (36.7 C). His blood pressure is 140/61 (abnormal) and his pulse is 65. His respiration is 19 and  oxygen saturation is 96%.   Wt Readings from Last 3 Encounters:  08/08/23 184 lb (83.5 kg)  08/01/23 183 lb (83 kg)  07/11/23 186 lb (84.4 kg)    Physical Exam Vitals reviewed.  HENT:     Head: Normocephalic and atraumatic.  Eyes:     Pupils: Pupils are equal, round, and reactive to light.  Cardiovascular:     Rate and Rhythm: Normal rate and regular rhythm.     Heart sounds: Normal heart sounds.  Pulmonary:     Effort: Pulmonary effort is normal.     Breath sounds: Normal breath sounds.  Abdominal:     General: Bowel sounds are normal.     Palpations: Abdomen is soft.  Musculoskeletal:        General: No tenderness or deformity. Normal range of motion.     Cervical back: Normal range of motion.  Lymphadenopathy:     Cervical: No cervical  adenopathy.  Skin:    General: Skin is warm and dry.     Findings: No erythema or rash.  Neurological:     Mental Status: He is alert and oriented to person, place, and time.  Psychiatric:        Behavior: Behavior normal.        Thought Content: Thought content normal.        Judgment: Judgment normal.     Lab Results  Component Value Date   WBC 5.1 08/08/2023   HGB 12.2 (L) 08/08/2023   HCT 37.1 (L) 08/08/2023   MCV 91.2 08/08/2023   PLT 171 08/08/2023     Chemistry      Component Value Date/Time   NA 140 08/08/2023 0849   K 4.2 08/08/2023 0849   CL 107 08/08/2023 0849   CO2 25 08/08/2023 0849   BUN 26 (H) 08/08/2023 0849   CREATININE 1.74 (H) 08/08/2023 0849      Component Value Date/Time   CALCIUM 9.5 08/08/2023 0849   ALKPHOS 84 08/08/2023 0849   AST 16 08/08/2023 0849   ALT 13 08/08/2023 0849   BILITOT 0.6 08/08/2023 0849     Impression and Plan: Mr. Caranci is a very nice 77 year old white male.  He has a history of recurrent clear-cell carcinoma of the kidney.  He had a early stage kidney cancer of the right kidney back in 2013.  He had a partial nephrectomy.  He subsequently had a recurrence.  This was treated with radiation therapy.  He developed progressive disease.  We now have him on immunotherapy.  Hopefully, he we will do okay with immunotherapy and will not have any problems with pruritus.  We will see how he does with the Atarax in the Medrol Dosepak.  I suppose if the Atarax and Solu-Medrol does not work, we just may have to stop his immunotherapy..  I know that pruritus can be a side effect of immunotherapy.  I would think this is quite unusual.  We will plan to get him back in 3 weeks for his third cycle, depending on the degree of his pruritus.   [name], MD 12/9/202410:00 AM

## 2023-08-08 NOTE — Progress Notes (Addendum)
Ok to proceed with Keytruda today with home steroid rx and SM.  Anola Gurney Sheld[place], [place], BCPS, BCOP 08/08/2023 10:30 AM

## 2023-08-09 ENCOUNTER — Other Ambulatory Visit: Payer: Self-pay

## 2023-08-11 ENCOUNTER — Other Ambulatory Visit: Payer: Self-pay

## 2023-08-29 ENCOUNTER — Encounter: Payer: Self-pay | Admitting: Hematology & Oncology

## 2023-08-29 ENCOUNTER — Other Ambulatory Visit: Payer: Self-pay

## 2023-08-29 ENCOUNTER — Inpatient Hospital Stay: Payer: Medicare Other

## 2023-08-29 ENCOUNTER — Inpatient Hospital Stay (HOSPITAL_BASED_OUTPATIENT_CLINIC_OR_DEPARTMENT_OTHER): Payer: Medicare Other | Admitting: Hematology & Oncology

## 2023-08-29 VITALS — BP 142/51 | HR 57

## 2023-08-29 VITALS — BP 122/55 | HR 66 | Temp 98.2°F | Resp 17 | Ht 67.0 in | Wt 183.0 lb

## 2023-08-29 DIAGNOSIS — C649 Malignant neoplasm of unspecified kidney, except renal pelvis: Secondary | ICD-10-CM

## 2023-08-29 DIAGNOSIS — Z5112 Encounter for antineoplastic immunotherapy: Secondary | ICD-10-CM | POA: Diagnosis not present

## 2023-08-29 LAB — CBC WITH DIFFERENTIAL (CANCER CENTER ONLY)
Abs Immature Granulocytes: 0.01 10*3/uL (ref 0.00–0.07)
Basophils Absolute: 0.1 10*3/uL (ref 0.0–0.1)
Basophils Relative: 1 %
Eosinophils Absolute: 0.3 10*3/uL (ref 0.0–0.5)
Eosinophils Relative: 7 %
HCT: 37.8 % — ABNORMAL LOW (ref 39.0–52.0)
Hemoglobin: 12.6 g/dL — ABNORMAL LOW (ref 13.0–17.0)
Immature Granulocytes: 0 %
Lymphocytes Relative: 20 %
Lymphs Abs: 0.8 10*3/uL (ref 0.7–4.0)
MCH: 29.9 pg (ref 26.0–34.0)
MCHC: 33.3 g/dL (ref 30.0–36.0)
MCV: 89.6 fL (ref 80.0–100.0)
Monocytes Absolute: 0.3 10*3/uL (ref 0.1–1.0)
Monocytes Relative: 9 %
Neutro Abs: 2.4 10*3/uL (ref 1.7–7.7)
Neutrophils Relative %: 63 %
Platelet Count: 141 10*3/uL — ABNORMAL LOW (ref 150–400)
RBC: 4.22 MIL/uL (ref 4.22–5.81)
RDW: 14 % (ref 11.5–15.5)
WBC Count: 3.8 10*3/uL — ABNORMAL LOW (ref 4.0–10.5)
nRBC: 0 % (ref 0.0–0.2)

## 2023-08-29 LAB — CMP (CANCER CENTER ONLY)
ALT: 16 U/L (ref 0–44)
AST: 15 U/L (ref 15–41)
Albumin: 4.2 g/dL (ref 3.5–5.0)
Alkaline Phosphatase: 79 U/L (ref 38–126)
Anion gap: 10 (ref 5–15)
BUN: 22 mg/dL (ref 8–23)
CO2: 23 mmol/L (ref 22–32)
Calcium: 9.2 mg/dL (ref 8.9–10.3)
Chloride: 108 mmol/L (ref 98–111)
Creatinine: 1.93 mg/dL — ABNORMAL HIGH (ref 0.61–1.24)
GFR, Estimated: 35 mL/min — ABNORMAL LOW (ref >60)
Glucose, Bld: 216 mg/dL — ABNORMAL HIGH (ref 70–99)
Potassium: 4 mmol/L (ref 3.5–5.1)
Sodium: 141 mmol/L (ref 135–145)
Total Bilirubin: 0.7 mg/dL (ref <1.2)
Total Protein: 6.7 g/dL (ref 6.5–8.1)

## 2023-08-29 LAB — LACTATE DEHYDROGENASE: LDH: 154 U/L (ref 98–192)

## 2023-08-29 LAB — TSH: TSH: 2.685 u[IU]/mL (ref 0.350–4.500)

## 2023-08-29 MED ORDER — HEPARIN SOD (PORK) LOCK FLUSH 100 UNIT/ML IV SOLN: 500.0000 [IU] | Freq: Once | INTRAVENOUS | Status: DC

## 2023-08-29 MED ORDER — SODIUM CHLORIDE 0.9% FLUSH
10.0000 mL | INTRAVENOUS | Status: DC | PRN
Start: 2023-08-29 — End: 2023-08-29
  Administered 2023-08-29 (×2): 10 mL

## 2023-08-29 MED ORDER — HEPARIN SOD (PORK) LOCK FLUSH 100 UNIT/ML IV SOLN
500.0000 [IU] | Freq: Once | INTRAVENOUS | Status: AC | PRN
  Administered 2023-08-29: 500 [IU]

## 2023-08-29 MED ORDER — SODIUM CHLORIDE 0.9 % IV SOLN: INTRAVENOUS | Status: DC

## 2023-08-29 MED ORDER — SODIUM CHLORIDE 0.9 % IV SOLN
200.0000 mg | Freq: Once | INTRAVENOUS | Status: AC
  Administered 2023-08-29: 200 mg via INTRAVENOUS
  Filled 2023-08-29: qty 8

## 2023-08-29 NOTE — Progress Notes (Signed)
Per Dr. Myna Hidalgo ok to treat with creatinine 1.93

## 2023-08-29 NOTE — Patient Instructions (Signed)
[facility] - A DEPT OF [facility]  Discharge Instructions: Thank you for choosing [facility] to provide your oncology and hematology care.   If you have a lab appointment with the Cancer Center, please go directly to the Cancer Center and check in at the registration area.  Wear comfortable clothing and clothing appropriate for easy access to any Portacath or PICC line.   We strive to give you quality time with your provider. You may need to reschedule your appointment if you arrive late (15 or more minutes).  Arriving late affects you and other patients whose appointments are after yours.  Also, if you miss three or more appointments without notifying the office, you may be dismissed from the clinic at the provider's discretion.      For prescription refill requests, have your pharmacy contact our office and allow 72 hours for refills to be completed.    Today you received the following chemotherapy and/or immunotherapy agents:  Keytruda      To help prevent nausea and vomiting after your treatment, we encourage you to take your nausea medication as directed.  BELOW ARE SYMPTOMS THAT SHOULD BE REPORTED IMMEDIATELY: *FEVER GREATER THAN 100.4 F (38 C) OR HIGHER *CHILLS OR SWEATING *NAUSEA AND VOMITING THAT IS NOT CONTROLLED WITH YOUR NAUSEA MEDICATION *UNUSUAL SHORTNESS OF BREATH *UNUSUAL BRUISING OR BLEEDING *URINARY PROBLEMS (pain or burning when urinating, or frequent urination) *BOWEL PROBLEMS (unusual diarrhea, constipation, pain near the anus) TENDERNESS IN MOUTH AND THROAT WITH OR WITHOUT PRESENCE OF ULCERS (sore throat, sores in mouth, or a toothache) UNUSUAL RASH, SWELLING OR PAIN  UNUSUAL VAGINAL DISCHARGE OR ITCHING   Items with * indicate a potential emergency and should be followed up as soon as possible or go to the Emergency Department if any problems should occur.  Please show the CHEMOTHERAPY ALERT CARD or IMMUNOTHERAPY  ALERT CARD at check-in to the Emergency Department and triage nurse. Should you have questions after your visit or need to cancel or reschedule your appointment, please contact [facility] - A DEPT OF [facility]  [phone] and follow the prompts.  Office hours are 8:00 a.m. to 4:30 p.m. Monday - Friday. Please note that voicemails left after 4:00 p.m. may not be returned until the following business day.  We are closed weekends and major holidays. You have access to a nurse at all times for urgent questions. Please call the main number to the clinic [phone] and follow the prompts.  For any non-urgent questions, you may also contact your provider using MyChart. We now offer e-Visits for anyone 74 and older to request care online for non-urgent symptoms. For details visit [email or web address].   Also download the MyChart app! Go to the app store, search "MyChart", open the app, select [facility], and log in with your MyChart username and password.  Pembrolizumab Injection What is this medication? PEMBROLIZUMAB (PEM broe LIZ ue mab) treats some types of cancer. It works by helping your immune system slow or stop the spread of cancer cells. It is a monoclonal antibody. This medicine may be used for other purposes; ask your health care provider or pharmacist if you have questions. COMMON BRAND NAME(S): Keytruda What should I tell my care team before I take this medication? They need to know if you have any of these conditions: Allogeneic stem cell transplant (uses someone else's stem cells) Autoimmune diseases, such as Crohn disease, ulcerative  colitis, lupus History of chest radiation Nervous system problems, such as Guillain-Barre syndrome, myasthenia gravis Organ transplant An unusual or allergic reaction to pembrolizumab, other medications, foods, dyes, or preservatives Pregnant or trying to get pregnant Breast-feeding How should I use this  medication? This medication is injected into a vein. It is given by your care team in a hospital or clinic setting. A special MedGuide will be given to you before each treatment. Be sure to read this information carefully each time. Talk to your care team about the use of this medication in children. While it may be prescribed for children as young as 6 months for selected conditions, precautions do apply. Overdosage: If you think you have taken too much of this medicine contact a poison control center or emergency room at once. NOTE: This medicine is only for you. Do not share this medicine with others. What if I miss a dose? Keep appointments for follow-up doses. It is important not to miss your dose. Call your care team if you are unable to keep an appointment. What may interact with this medication? Interactions have not been studied. This list may not describe all possible interactions. Give your health care provider a list of all the medicines, herbs, non-prescription drugs, or dietary supplements you use. Also tell them if you smoke, drink alcohol, or use illegal drugs. Some items may interact with your medicine. What should I watch for while using this medication? Your condition will be monitored carefully while you are receiving this medication. You may need blood work while taking this medication. This medication may cause serious skin reactions. They can happen weeks to months after starting the medication. Contact your care team right away if you notice fevers or flu-like symptoms with a rash. The rash may be red or purple and then turn into blisters or peeling of the skin. You may also notice a red rash with swelling of the face, lips, or lymph nodes in your neck or under your arms. Tell your care team right away if you have any change in your eyesight. Talk to your care team if you may be pregnant. Serious birth defects can occur if you take this medication during pregnancy and for 4  months after the last dose. You will need a negative pregnancy test before starting this medication. Contraception is recommended while taking this medication and for 4 months after the last dose. Your care team can help you find the option that works for you. Do not breastfeed while taking this medication and for 4 months after the last dose. What side effects may I notice from receiving this medication? Side effects that you should report to your care team as soon as possible: Allergic reactions--skin rash, itching, hives, swelling of the face, lips, tongue, or throat Dry cough, shortness of breath or trouble breathing Eye pain, redness, irritation, or discharge with blurry or decreased vision Heart muscle inflammation--unusual weakness or fatigue, shortness of breath, chest pain, fast or irregular heartbeat, dizziness, swelling of the ankles, feet, or hands Hormone gland problems--headache, sensitivity to light, unusual weakness or fatigue, dizziness, fast or irregular heartbeat, increased sensitivity to cold or heat, excessive sweating, constipation, hair loss, increased thirst or amount of urine, tremors or shaking, irritability Infusion reactions--chest pain, shortness of breath or trouble breathing, feeling faint or lightheaded Kidney injury (glomerulonephritis)--decrease in the amount of urine, red or dark brown urine, foamy or bubbly urine, swelling of the ankles, hands, or feet Liver injury--right upper belly pain, loss of  appetite, nausea, light-colored stool, dark yellow or brown urine, yellowing skin or eyes, unusual weakness or fatigue Pain, tingling, or numbness in the hands or feet, muscle weakness, change in vision, confusion or trouble speaking, loss of balance or coordination, trouble walking, seizures Rash, fever, and swollen lymph nodes Redness, blistering, peeling, or loosening of the skin, including inside the mouth Sudden or severe stomach pain, bloody diarrhea, fever, nausea,  vomiting Side effects that usually do not require medical attention (report to your care team if they continue or are bothersome): Bone, joint, or muscle pain Diarrhea Fatigue Loss of appetite Nausea Skin rash This list may not describe all possible side effects. Call your doctor for medical advice about side effects. You may report side effects to FDA at 1-800-FDA-1088. Where should I keep my medication? This medication is given in a hospital or clinic. It will not be stored at home. NOTE: This sheet is a summary. It may not cover all possible information. If you have questions about this medicine, talk to your doctor, pharmacist, or health care provider.  2024 Elsevier/Gold Standard (2021-12-29 00:00:00)

## 2023-08-29 NOTE — Progress Notes (Signed)
Hematology and Oncology Follow Up Visit  JAMYSON MITCHAM [id number] March 05, 1946 77 y.o. 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 Anderer is in for follow-up.  He is doing pretty well.  He really has had no specific complaints.  He has occasional itching.  He has had a good appetite.  Had a very nice Christmas.  He has had no nausea or vomiting.  There has been 1 episode of diarrhea.  He has had no rashes.  There is been no leg swelling.  He has had no cough or shortness of breath.  He has had no headaches.  His last TSH that we checked was 1.76.  Overall, I would say that his performance status is probably ECOG 1.   Medications:  Current Outpatient Medications:    amLODipine (NORVASC) 10 MG tablet, Take 10 mg by mouth daily., Disp: , Rfl:    aspirin 81 MG EC tablet, Take 1 tablet by mouth daily., Disp: , Rfl:    Cholecalciferol 25 MCG (1000 UT) capsule, Take by mouth daily., Disp: , Rfl:    famotidine (PEPCID) 20 MG tablet, Take by mouth daily., Disp: , Rfl:    Garlic 1000 MG CAPS, Take by mouth daily., Disp: , Rfl:    hydrOXYzine (ATARAX) 25 MG tablet, Take 1 tablet (25 mg total) by mouth every 6 (six) hours as needed., Disp: 60 tablet, Rfl: 2   lidocaine-prilocaine (EMLA) cream, Apply 1 Application topically as needed. Place on port one hour prior to port access and cover with plastic wrap, Disp: 30 g, Rfl: 0   methylPREDNISolone (MEDROL DOSEPAK) 4 MG TBPK tablet, Take 6 x 1 day, then 5 one day, 4 the next day, 3, then 2, then 1, Disp: 21 tablet, Rfl: 2   Omega-3 1000 MG CAPS, Take 1 capsule by mouth daily., Disp: , Rfl:    ondansetron (ZOFRAN) 8 MG tablet, Take 1 tablet (8 mg total) by mouth every 8 (eight) hours as needed for nausea or vomiting., Disp: 30 tablet,  Rfl: 1   prochlorperazine (COMPAZINE) 10 MG tablet, Take 1 tablet (10 mg total) by mouth every 6 (six) hours as needed for nausea or vomiting., Disp: 30 tablet, Rfl: 1   promethazine (PHENERGAN) 25 MG tablet, Take 1 tablet (25 mg total) by mouth every 6 (six) hours as needed for nausea or vomiting. (Patient not taking: Reported on 02/24/2023), Disp: 30 tablet, Rfl: 0   rosuvastatin (CRESTOR) 20 MG tablet, TAKE 1 TABLET BY MOUTH EVERY DAY FOR CHOLESTEROL, Disp: , Rfl:    telmisartan (MICARDIS) 80 MG tablet, TAKE ONE TABLET BY MOUTH EACH DAY FOR BLOOD PRESSURE, Disp: , Rfl:   Allergies:  Allergies  Allergen Reactions   Atorvastatin Other (See Comments)    Joint pain     Pitavastatin Other (See Comments)    Headache, constipation    Simvastatin Other (See Comments)    Joint pain      Past Medical History, Surgical history, Social history, and Family History were reviewed and updated.  Review of Systems: Review of Systems  Constitutional: Negative.   HENT:  Negative.    Eyes: Negative.   Respiratory: Negative.    Cardiovascular: Negative.   Gastrointestinal: Negative.   Endocrine: Negative.   Genitourinary: Negative.    Musculoskeletal: Negative.  Skin: Negative.   Neurological: Negative.   Hematological: Negative.   Psychiatric/Behavioral: Negative.      Physical Exam:  height is 5\' 7"  (1.702 m) and weight is 183 lb (83 kg). His oral temperature is 98.2 F (36.8 C). His blood pressure is 122/55 (abnormal) and his pulse is 66. His respiration is 17 and oxygen saturation is 99%.   Wt Readings from Last 3 Encounters:  08/29/23 183 lb (83 kg)  08/08/23 184 lb (83.5 kg)  08/01/23 183 lb (83 kg)    Physical Exam Vitals reviewed.  HENT:     Head: Normocephalic and atraumatic.  Eyes:     Pupils: Pupils are equal, round, and reactive to light.  Cardiovascular:     Rate and Rhythm: Normal rate and regular rhythm.     Heart sounds: Normal heart sounds.  Pulmonary:      Effort: Pulmonary effort is normal.     Breath sounds: Normal breath sounds.  Abdominal:     General: Bowel sounds are normal.     Palpations: Abdomen is soft.  Musculoskeletal:        General: No tenderness or deformity. Normal range of motion.     Cervical back: Normal range of motion.  Lymphadenopathy:     Cervical: No cervical adenopathy.  Skin:    General: Skin is warm and dry.     Findings: No erythema or rash.  Neurological:     Mental Status: He is alert and oriented to person, place, and time.  Psychiatric:        Behavior: Behavior normal.        Thought Content: Thought content normal.        Judgment: Judgment normal.      Lab Results  Component Value Date   WBC 3.8 (L) 08/29/2023   HGB 12.6 (L) 08/29/2023   HCT 37.8 (L) 08/29/2023   MCV 89.6 08/29/2023   PLT 141 (L) 08/29/2023     Chemistry      Component Value Date/Time   NA 141 08/29/2023 0840   K 4.0 08/29/2023 0840   CL 108 08/29/2023 0840   CO2 23 08/29/2023 0840   BUN 22 08/29/2023 0840   CREATININE 1.93 (H) 08/29/2023 0840      Component Value Date/Time   CALCIUM 9.2 08/29/2023 0840   ALKPHOS 79 08/29/2023 0840   AST 15 08/29/2023 0840   ALT 16 08/29/2023 0840   BILITOT 0.7 08/29/2023 0840     Impression and Plan: Mr. Dechene is a very nice 77 year old white male.  He has a history of recurrent clear-cell carcinoma of the kidney.  He had a early stage kidney cancer of the right kidney back in 2013.  He had a partial nephrectomy.  He subsequently had a recurrence.  This was treated with radiation therapy.  He developed progressive disease.  We now have him on immunotherapy.  This will be his third cycle of immunotherapy.  After his fourth cycle, we will then plan for follow-up scans.  I am glad that his quality of life is doing pretty well right now.  I will plan to see him back in 3 weeks.    [name], MD 12/30/20249:37 AM

## 2023-08-29 NOTE — Patient Instructions (Signed)

## 2023-08-30 ENCOUNTER — Other Ambulatory Visit: Payer: Self-pay

## 2023-08-30 LAB — T4: T4, Total: 7.7 ug/dL (ref 4.5–12.0)

## 2023-09-19 ENCOUNTER — Inpatient Hospital Stay (HOSPITAL_BASED_OUTPATIENT_CLINIC_OR_DEPARTMENT_OTHER): Payer: Medicare Other | Admitting: Hematology & Oncology

## 2023-09-19 ENCOUNTER — Inpatient Hospital Stay: Payer: Medicare Other

## 2023-09-19 ENCOUNTER — Encounter: Payer: Self-pay | Admitting: Hematology & Oncology

## 2023-09-19 ENCOUNTER — Other Ambulatory Visit: Payer: Self-pay

## 2023-09-19 ENCOUNTER — Inpatient Hospital Stay: Payer: Medicare Other | Attending: Hematology & Oncology

## 2023-09-19 VITALS — BP 132/68 | HR 64 | Temp 97.8°F | Resp 19 | Ht 67.0 in | Wt 185.0 lb

## 2023-09-19 DIAGNOSIS — C649 Malignant neoplasm of unspecified kidney, except renal pelvis: Secondary | ICD-10-CM | POA: Diagnosis not present

## 2023-09-19 DIAGNOSIS — Z79899 Other long term (current) drug therapy: Secondary | ICD-10-CM | POA: Diagnosis not present

## 2023-09-19 DIAGNOSIS — Z5112 Encounter for antineoplastic immunotherapy: Secondary | ICD-10-CM | POA: Insufficient documentation

## 2023-09-19 DIAGNOSIS — C7801 Secondary malignant neoplasm of right lung: Secondary | ICD-10-CM | POA: Insufficient documentation

## 2023-09-19 DIAGNOSIS — C7989 Secondary malignant neoplasm of other specified sites: Secondary | ICD-10-CM | POA: Diagnosis not present

## 2023-09-19 DIAGNOSIS — C641 Malignant neoplasm of right kidney, except renal pelvis: Secondary | ICD-10-CM | POA: Diagnosis present

## 2023-09-19 DIAGNOSIS — C786 Secondary malignant neoplasm of retroperitoneum and peritoneum: Secondary | ICD-10-CM | POA: Insufficient documentation

## 2023-09-19 DIAGNOSIS — Z452 Encounter for adjustment and management of vascular access device: Secondary | ICD-10-CM | POA: Diagnosis not present

## 2023-09-19 LAB — CBC WITH DIFFERENTIAL (CANCER CENTER ONLY)
Abs Immature Granulocytes: 0.02 10*3/uL (ref 0.00–0.07)
Basophils Absolute: 0.1 10*3/uL (ref 0.0–0.1)
Basophils Relative: 1 %
Eosinophils Absolute: 0.2 10*3/uL (ref 0.0–0.5)
Eosinophils Relative: 4 %
HCT: 40.2 % (ref 39.0–52.0)
Hemoglobin: 12.9 g/dL — ABNORMAL LOW (ref 13.0–17.0)
Immature Granulocytes: 0 %
Lymphocytes Relative: 19 %
Lymphs Abs: 1.1 10*3/uL (ref 0.7–4.0)
MCH: 29.5 pg (ref 26.0–34.0)
MCHC: 32.1 g/dL (ref 30.0–36.0)
MCV: 92 fL (ref 80.0–100.0)
Monocytes Absolute: 0.6 10*3/uL (ref 0.1–1.0)
Monocytes Relative: 9 %
Neutro Abs: 3.9 10*3/uL (ref 1.7–7.7)
Neutrophils Relative %: 67 %
Platelet Count: 149 10*3/uL — ABNORMAL LOW (ref 150–400)
RBC: 4.37 MIL/uL (ref 4.22–5.81)
RDW: 13.9 % (ref 11.5–15.5)
WBC Count: 5.9 10*3/uL (ref 4.0–10.5)
nRBC: 0 % (ref 0.0–0.2)

## 2023-09-19 LAB — CMP (CANCER CENTER ONLY)
ALT: 11 U/L (ref 0–44)
AST: 14 U/L — ABNORMAL LOW (ref 15–41)
Albumin: 4.6 g/dL (ref 3.5–5.0)
Alkaline Phosphatase: 90 U/L (ref 38–126)
Anion gap: 11 (ref 5–15)
BUN: 29 mg/dL — ABNORMAL HIGH (ref 8–23)
CO2: 22 mmol/L (ref 22–32)
Calcium: 9.3 mg/dL (ref 8.9–10.3)
Chloride: 108 mmol/L (ref 98–111)
Creatinine: 1.92 mg/dL — ABNORMAL HIGH (ref 0.61–1.24)
GFR, Estimated: 35 mL/min — ABNORMAL LOW (ref >60)
Glucose, Bld: 102 mg/dL — ABNORMAL HIGH (ref 70–99)
Potassium: 4.5 mmol/L (ref 3.5–5.1)
Sodium: 141 mmol/L (ref 135–145)
Total Bilirubin: 0.9 mg/dL (ref 0.0–1.2)
Total Protein: 7.3 g/dL (ref 6.5–8.1)

## 2023-09-19 LAB — TSH: TSH: 1.907 u[IU]/mL (ref 0.350–4.500)

## 2023-09-19 MED ORDER — SODIUM CHLORIDE 0.9% FLUSH
10.0000 mL | INTRAVENOUS | Status: DC | PRN
Start: 2023-09-19 — End: 2023-09-19
  Administered 2023-09-19: 10 mL

## 2023-09-19 MED ORDER — HEPARIN SOD (PORK) LOCK FLUSH 100 UNIT/ML IV SOLN
500.0000 [IU] | Freq: Once | INTRAVENOUS | Status: AC | PRN
  Administered 2023-09-19: 500 [IU]

## 2023-09-19 MED ORDER — ALTEPLASE 2 MG IJ SOLR
2.0000 mg | Freq: Once | INTRAMUSCULAR | Status: AC | PRN
  Administered 2023-09-19: 2 mg
  Filled 2023-09-19: qty 2

## 2023-09-19 MED ORDER — PEMBROLIZUMAB CHEMO INJECTION 100 MG/4ML
200.0000 mg | Freq: Once | INTRAVENOUS | Status: AC
  Administered 2023-09-19: 200 mg via INTRAVENOUS
  Filled 2023-09-19: qty 8

## 2023-09-19 MED ORDER — SODIUM CHLORIDE 0.9 % IV SOLN: INTRAVENOUS | Status: DC

## 2023-09-19 NOTE — Progress Notes (Signed)
Hematology and Oncology Follow Up Visit  Michael Knox [id number] 1946-02-14 78 y.o. 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  So far, he is tolerating the immunotherapy quite nicely.  He does have a little bit of pruritus.  However, I think the Benadryl/Atarax does seem to help.  He has had no diarrhea.  He has had no nausea or vomiting.  He has had no fever.  There has been no bleeding.  He has had no leg swelling.  His last TSH was 2.7.  He has had no headache.  Overall, I would say that his performance status is probably ECOG 1.   Medications:  Current Outpatient Medications:    amLODipine (NORVASC) 10 MG tablet, Take 10 mg by mouth daily., Disp: , Rfl:    aspirin 81 MG EC tablet, Take 1 tablet by mouth daily., Disp: , Rfl:    Cholecalciferol 25 MCG (1000 UT) capsule, Take by mouth daily., Disp: , Rfl:    famotidine (PEPCID) 20 MG tablet, Take by mouth daily., Disp: , Rfl:    Garlic 1000 MG CAPS, Take by mouth daily., Disp: , Rfl:    hydrOXYzine (ATARAX) 25 MG tablet, Take 1 tablet (25 mg total) by mouth every 6 (six) hours as needed., Disp: 60 tablet, Rfl: 2   lidocaine-prilocaine (EMLA) cream, Apply 1 Application topically as needed. Place on port one hour prior to port access and cover with plastic wrap, Disp: 30 g, Rfl: 0   methylPREDNISolone (MEDROL DOSEPAK) 4 MG TBPK tablet, Take 6 x 1 day, then 5 one day, 4 the next day, 3, then 2, then 1, Disp: 21 tablet, Rfl: 2   Omega-3 1000 MG CAPS, Take 1 capsule by mouth daily., Disp: , Rfl:    ondansetron (ZOFRAN) 8 MG tablet, Take 1 tablet (8 mg total) by mouth every 8 (eight) hours as needed for nausea or vomiting., Disp: 30 tablet, Rfl: 1   prochlorperazine (COMPAZINE) 10 MG tablet,  Take 1 tablet (10 mg total) by mouth every 6 (six) hours as needed for nausea or vomiting., Disp: 30 tablet, Rfl: 1   promethazine (PHENERGAN) 25 MG tablet, Take 1 tablet (25 mg total) by mouth every 6 (six) hours as needed for nausea or vomiting. (Patient not taking: Reported on 02/24/2023), Disp: 30 tablet, Rfl: 0   rosuvastatin (CRESTOR) 20 MG tablet, TAKE 1 TABLET BY MOUTH EVERY DAY FOR CHOLESTEROL, Disp: , Rfl:    telmisartan (MICARDIS) 80 MG tablet, TAKE ONE TABLET BY MOUTH EACH DAY FOR BLOOD PRESSURE, Disp: , Rfl:   Allergies:  Allergies  Allergen Reactions   Atorvastatin Other (See Comments)    Joint pain     Pitavastatin Other (See Comments)    Headache, constipation    Simvastatin Other (See Comments)    Joint pain      Past Medical History, Surgical history, Social history, and Family History were reviewed and updated.  Review of Systems: Review of Systems  Constitutional: Negative.   HENT:  Negative.    Eyes: Negative.   Respiratory: Negative.    Cardiovascular: Negative.   Gastrointestinal: Negative.   Endocrine: Negative.   Genitourinary: Negative.    Musculoskeletal: Negative.   Skin: Negative.   Neurological: Negative.   Hematological: Negative.  Psychiatric/Behavioral: Negative.      Physical Exam:  height is 5\' 7"  (1.702 m) and weight is 185 lb (83.9 kg). His oral temperature is 97.8 F (36.6 C). His blood pressure is 132/68 and his pulse is 64. His respiration is 19 and oxygen saturation is 99%.   Wt Readings from Last 3 Encounters:  09/19/23 185 lb (83.9 kg)  08/29/23 183 lb (83 kg)  08/08/23 184 lb (83.5 kg)    Physical Exam Vitals reviewed.  HENT:     Head: Normocephalic and atraumatic.  Eyes:     Pupils: Pupils are equal, round, and reactive to light.  Cardiovascular:     Rate and Rhythm: Normal rate and regular rhythm.     Heart sounds: Normal heart sounds.  Pulmonary:     Effort: Pulmonary effort is normal.     Breath sounds:  Normal breath sounds.  Abdominal:     General: Bowel sounds are normal.     Palpations: Abdomen is soft.  Musculoskeletal:        General: No tenderness or deformity. Normal range of motion.     Cervical back: Normal range of motion.  Lymphadenopathy:     Cervical: No cervical adenopathy.  Skin:    General: Skin is warm and dry.     Findings: No erythema or rash.  Neurological:     Mental Status: He is alert and oriented to person, place, and time.  Psychiatric:        Behavior: Behavior normal.        Thought Content: Thought content normal.        Judgment: Judgment normal.      Lab Results  Component Value Date   WBC 3.8 (L) 08/29/2023   HGB 12.6 (L) 08/29/2023   HCT 37.8 (L) 08/29/2023   MCV 89.6 08/29/2023   PLT 141 (L) 08/29/2023     Chemistry      Component Value Date/Time   NA 141 08/29/2023 0840   K 4.0 08/29/2023 0840   CL 108 08/29/2023 0840   CO2 23 08/29/2023 0840   BUN 22 08/29/2023 0840   CREATININE 1.93 (H) 08/29/2023 0840      Component Value Date/Time   CALCIUM 9.2 08/29/2023 0840   ALKPHOS 79 08/29/2023 0840   AST 15 08/29/2023 0840   ALT 16 08/29/2023 0840   BILITOT 0.7 08/29/2023 0840     Impression and Plan: Michael Knox is a very nice 78 year old white male.  He has a history of recurrent clear-cell carcinoma of the kidney.  He had a early stage kidney cancer of the right kidney back in 2013.  He had a partial nephrectomy.  He subsequently had a recurrence.  This was treated with radiation therapy.  He developed progressive disease.  We now have him on immunotherapy.  This will be his 4th cycle of immunotherapy.  After this cycle, we will then plan for follow-up scans.  I am glad that his quality of life is doing pretty well right now.  I will plan to see him back in 3 weeks.    [name], MD 1/20/20259:22 AM

## 2023-09-19 NOTE — Patient Instructions (Signed)

## 2023-09-19 NOTE — Progress Notes (Signed)
Ok to treat with creatinine of 1.92 per Dr Myna Hidalgo. dph

## 2023-09-20 ENCOUNTER — Other Ambulatory Visit: Payer: Self-pay

## 2023-09-22 ENCOUNTER — Other Ambulatory Visit: Payer: Self-pay

## 2023-09-26 ENCOUNTER — Encounter: Payer: Self-pay | Admitting: *Deleted

## 2023-09-26 ENCOUNTER — Ambulatory Visit (HOSPITAL_BASED_OUTPATIENT_CLINIC_OR_DEPARTMENT_OTHER)
Admission: RE | Admit: 2023-09-26 | Discharge: 2023-09-26 | Disposition: A | Discharge status: Home / Self Care | Payer: Medicare Other | Source: Ambulatory Visit | Attending: Hematology & Oncology | Admitting: Hematology & Oncology

## 2023-09-26 DIAGNOSIS — C7989 Secondary malignant neoplasm of other specified sites: Secondary | ICD-10-CM | POA: Insufficient documentation

## 2023-09-26 DIAGNOSIS — C649 Malignant neoplasm of unspecified kidney, except renal pelvis: Secondary | ICD-10-CM | POA: Insufficient documentation

## 2023-10-10 ENCOUNTER — Other Ambulatory Visit: Payer: Self-pay

## 2023-10-10 ENCOUNTER — Inpatient Hospital Stay: Payer: Medicare Other | Attending: Hematology & Oncology

## 2023-10-10 ENCOUNTER — Inpatient Hospital Stay: Payer: Medicare Other

## 2023-10-10 ENCOUNTER — Encounter: Payer: Self-pay | Admitting: Hematology & Oncology

## 2023-10-10 ENCOUNTER — Inpatient Hospital Stay (HOSPITAL_BASED_OUTPATIENT_CLINIC_OR_DEPARTMENT_OTHER): Payer: Medicare Other | Admitting: Hematology & Oncology

## 2023-10-10 VITALS — BP 119/51 | HR 56 | Temp 97.8°F | Resp 18 | Ht 67.0 in | Wt 185.0 lb

## 2023-10-10 DIAGNOSIS — C649 Malignant neoplasm of unspecified kidney, except renal pelvis: Secondary | ICD-10-CM

## 2023-10-10 DIAGNOSIS — C641 Malignant neoplasm of right kidney, except renal pelvis: Secondary | ICD-10-CM | POA: Diagnosis present

## 2023-10-10 DIAGNOSIS — Z5112 Encounter for antineoplastic immunotherapy: Secondary | ICD-10-CM | POA: Insufficient documentation

## 2023-10-10 DIAGNOSIS — C7801 Secondary malignant neoplasm of right lung: Secondary | ICD-10-CM | POA: Insufficient documentation

## 2023-10-10 DIAGNOSIS — C7989 Secondary malignant neoplasm of other specified sites: Secondary | ICD-10-CM | POA: Diagnosis not present

## 2023-10-10 DIAGNOSIS — Z79620 Long term (current) use of immunosuppressive biologic: Secondary | ICD-10-CM | POA: Diagnosis not present

## 2023-10-10 LAB — CBC WITH DIFFERENTIAL (CANCER CENTER ONLY)
Abs Immature Granulocytes: 0.02 10*3/uL (ref 0.00–0.07)
Basophils Absolute: 0.1 10*3/uL (ref 0.0–0.1)
Basophils Relative: 1 %
Eosinophils Absolute: 0.5 10*3/uL (ref 0.0–0.5)
Eosinophils Relative: 10 %
HCT: 38.1 % — ABNORMAL LOW (ref 39.0–52.0)
Hemoglobin: 12.6 g/dL — ABNORMAL LOW (ref 13.0–17.0)
Immature Granulocytes: 0 %
Lymphocytes Relative: 17 %
Lymphs Abs: 0.9 10*3/uL (ref 0.7–4.0)
MCH: 29.6 pg (ref 26.0–34.0)
MCHC: 33.1 g/dL (ref 30.0–36.0)
MCV: 89.4 fL (ref 80.0–100.0)
Monocytes Absolute: 0.5 10*3/uL (ref 0.1–1.0)
Monocytes Relative: 10 %
Neutro Abs: 3.2 10*3/uL (ref 1.7–7.7)
Neutrophils Relative %: 62 %
Platelet Count: 152 10*3/uL (ref 150–400)
RBC: 4.26 MIL/uL (ref 4.22–5.81)
RDW: 13.9 % (ref 11.5–15.5)
WBC Count: 5.1 10*3/uL (ref 4.0–10.5)
nRBC: 0 % (ref 0.0–0.2)

## 2023-10-10 LAB — CMP (CANCER CENTER ONLY)
ALT: 14 U/L (ref 0–44)
AST: 16 U/L (ref 15–41)
Albumin: 4.4 g/dL (ref 3.5–5.0)
Alkaline Phosphatase: 86 U/L (ref 38–126)
Anion gap: 7 (ref 5–15)
BUN: 33 mg/dL — ABNORMAL HIGH (ref 8–23)
CO2: 24 mmol/L (ref 22–32)
Calcium: 9.3 mg/dL (ref 8.9–10.3)
Chloride: 108 mmol/L (ref 98–111)
Creatinine: 2.17 mg/dL — ABNORMAL HIGH (ref 0.61–1.24)
GFR, Estimated: 30 mL/min — ABNORMAL LOW (ref >60)
Glucose, Bld: 99 mg/dL (ref 70–99)
Potassium: 4.7 mmol/L (ref 3.5–5.1)
Sodium: 139 mmol/L (ref 135–145)
Total Bilirubin: 0.7 mg/dL (ref 0.0–1.2)
Total Protein: 7.1 g/dL (ref 6.5–8.1)

## 2023-10-10 LAB — TSH: TSH: 2.178 u[IU]/mL (ref 0.350–4.500)

## 2023-10-10 LAB — LACTATE DEHYDROGENASE: LDH: 147 U/L (ref 98–192)

## 2023-10-10 MED ORDER — SODIUM CHLORIDE 0.9% FLUSH: 10.0000 mL | INTRAVENOUS | Status: DC | PRN

## 2023-10-10 MED ORDER — SODIUM CHLORIDE 0.9% FLUSH
10.0000 mL | INTRAVENOUS | Status: DC | PRN
  Administered 2023-10-10: 10 mL

## 2023-10-10 MED ORDER — SODIUM CHLORIDE 0.9% FLUSH
3.0000 mL | INTRAVENOUS | Status: DC | PRN
Start: 2023-10-10 — End: 2023-10-10

## 2023-10-10 MED ORDER — HEPARIN SOD (PORK) LOCK FLUSH 100 UNIT/ML IV SOLN
500.0000 [IU] | Freq: Once | INTRAVENOUS | Status: AC | PRN
Start: 2023-10-10 — End: 2023-10-10
  Administered 2023-10-10: 500 [IU]

## 2023-10-10 MED ORDER — SODIUM CHLORIDE 0.9 % IV SOLN: INTRAVENOUS | Status: DC

## 2023-10-10 MED ORDER — SODIUM CHLORIDE 0.9 % IV SOLN
200.0000 mg | Freq: Once | INTRAVENOUS | Status: AC
  Administered 2023-10-10: 200 mg via INTRAVENOUS
  Filled 2023-10-10: qty 8

## 2023-10-10 MED ORDER — HEPARIN SOD (PORK) LOCK FLUSH 100 UNIT/ML IV SOLN: 500.0000 [IU] | Freq: Once | INTRAVENOUS | Status: DC

## 2023-10-10 NOTE — Patient Instructions (Signed)
 [facility] - A DEPT OF [facility]  Discharge Instructions: Thank you for choosing [facility] to provide your oncology and hematology care.   If you have a lab appointment with the Cancer Center, please go directly to the Cancer Center and check in at the registration area.  Wear comfortable clothing and clothing appropriate for easy access to any Portacath or PICC line.   We strive to give you quality time with your provider. You may need to reschedule your appointment if you arrive late (15 or more minutes).  Arriving late affects you and other patients whose appointments are after yours.  Also, if you miss three or more appointments without notifying the office, you may be dismissed from the clinic at the provider's discretion.      For prescription refill requests, have your pharmacy contact our office and allow 72 hours for refills to be completed.    Today you received the following chemotherapy and/or immunotherapy agents Keytruda       To help prevent nausea and vomiting after your treatment, we encourage you to take your nausea medication as directed.  BELOW ARE SYMPTOMS THAT SHOULD BE REPORTED IMMEDIATELY: *FEVER GREATER THAN 100.4 F (38 C) OR HIGHER *CHILLS OR SWEATING *NAUSEA AND VOMITING THAT IS NOT CONTROLLED WITH YOUR NAUSEA MEDICATION *UNUSUAL SHORTNESS OF BREATH *UNUSUAL BRUISING OR BLEEDING *URINARY PROBLEMS (pain or burning when urinating, or frequent urination) *BOWEL PROBLEMS (unusual diarrhea, constipation, pain near the anus) TENDERNESS IN MOUTH AND THROAT WITH OR WITHOUT PRESENCE OF ULCERS (sore throat, sores in mouth, or a toothache) UNUSUAL RASH, SWELLING OR PAIN  UNUSUAL VAGINAL DISCHARGE OR ITCHING   Items with * indicate a potential emergency and should be followed up as soon as possible or go to the Emergency Department if any problems should occur.  Please show the CHEMOTHERAPY ALERT CARD or IMMUNOTHERAPY  ALERT CARD at check-in to the Emergency Department and triage nurse. Should you have questions after your visit or need to cancel or reschedule your appointment, please contact [facility] - A DEPT OF [facility]  [phone] and follow the prompts.  Office hours are 8:00 a.m. to 4:30 p.m. Monday - Friday. Please note that voicemails left after 4:00 p.m. may not be returned until the following business day.  We are closed weekends and major holidays. You have access to a nurse at all times for urgent questions. Please call the main number to the clinic [phone] and follow the prompts.  For any non-urgent questions, you may also contact your provider using MyChart. We now offer e-Visits for anyone 7 and older to request care online for non-urgent symptoms. For details visit [email or web address].   Also download the MyChart app! Go to the app store, search "MyChart", open the app, select [place], and log in with your MyChart username and password.

## 2023-10-10 NOTE — Progress Notes (Signed)
OK to treat with today's creatinine level per Dr. Ennever. 

## 2023-10-10 NOTE — Patient Instructions (Signed)

## 2023-10-10 NOTE — Progress Notes (Signed)
 Hematology and Oncology Follow Up Visit  Michael Knox [id number] 01-28-1946 78 y.o. 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  He did have a CT scan that was done.  This was done on 09/26/2023.  Everything looked stable.  However, there was a nodule in the right upper lobe measuring 1.9 x 1.5 x 1.6 cm.  This was little bit larger.  I spoken to Michael Knox of Radiation Oncology.  We will see if he feels that Michael Knox would qualify for radiosurgery.  Michael Knox has had no problems with cough or shortness of breath.  He has had no issues with nausea or vomiting.  He has had a little bit of pruritus but not that bad overall.  He has had no issues with bleeding.  He has had no diarrhea.  There is been no fever.  His last TSH back in January was 1.9.  Currently, I would have to say that his performance status is ECOG 1-2.    Medications:  Current Outpatient Medications:    amLODipine (NORVASC) 10 MG tablet, Take 10 mg by mouth daily., Disp: , Rfl:    aspirin 81 MG EC tablet, Take 1 tablet by mouth daily., Disp: , Rfl:    Cholecalciferol 25 MCG (1000 UT) capsule, Take by mouth daily., Disp: , Rfl:    famotidine  (PEPCID ) 20 MG tablet, Take by mouth daily., Disp: , Rfl:    Garlic 1000 MG CAPS, Take by mouth daily., Disp: , Rfl:    hydrOXYzine  (ATARAX ) 25 MG tablet, Take 1 tablet (25 mg total) by mouth every 6 (six) hours as needed., Disp: 60 tablet, Rfl: 2   lidocaine -prilocaine  (EMLA ) cream, Apply 1 Application topically as needed. Place on port one hour prior to port access and cover with plastic wrap, Disp: 30 g, Rfl: 0   methylPREDNISolone  (MEDROL  DOSEPAK) 4 MG TBPK tablet, Take 6 x 1 day, then 5 one day, 4 the next day, 3, then 2, then 1, Disp:  21 tablet, Rfl: 2   Omega-3 1000 MG CAPS, Take 1 capsule by mouth daily., Disp: , Rfl:    ondansetron  (ZOFRAN ) 8 MG tablet, Take 1 tablet (8 mg total) by mouth every 8 (eight) hours as needed for nausea or vomiting., Disp: 30 tablet, Rfl: 1   prochlorperazine  (COMPAZINE ) 10 MG tablet, Take 1 tablet (10 mg total) by mouth every 6 (six) hours as needed for nausea or vomiting., Disp: 30 tablet, Rfl: 1   promethazine  (PHENERGAN ) 25 MG tablet, Take 1 tablet (25 mg total) by mouth every 6 (six) hours as needed for nausea or vomiting. (Patient not taking: Reported on 02/24/2023), Disp: 30 tablet, Rfl: 0   rosuvastatin (CRESTOR) 20 MG tablet, TAKE 1 TABLET BY MOUTH EVERY DAY FOR CHOLESTEROL, Disp: , Rfl:    telmisartan (MICARDIS) 80 MG tablet, TAKE ONE TABLET BY MOUTH EACH DAY FOR BLOOD PRESSURE, Disp: , Rfl:   Allergies:  Allergies  Allergen Reactions   Atorvastatin Other (See Comments)    Joint pain     Pitavastatin Other (See Comments)    Headache, constipation    Simvastatin Other (See Comments)    Joint pain      Past Medical History, Surgical history, Social history, and Family History were reviewed and  updated.  Review of Systems: Review of Systems  Constitutional: Negative.   HENT:  Negative.    Eyes: Negative.   Respiratory: Negative.    Cardiovascular: Negative.   Gastrointestinal: Negative.   Endocrine: Negative.   Genitourinary: Negative.    Musculoskeletal: Negative.   Skin: Negative.   Neurological: Negative.   Hematological: Negative.   Psychiatric/Behavioral: Negative.      Physical Exam:  height is 5\' 7"  (1.702 m) and weight is 185 lb (83.9 kg). His oral temperature is 97.8 F (36.6 C). His blood pressure is 119/51 (abnormal) and his pulse is 56 (abnormal). His respiration is 18 and oxygen saturation is 99%.   Wt Readings from Last 3 Encounters:  10/10/23 185 lb (83.9 kg)  09/19/23 185 lb (83.9 kg)  08/29/23 183 lb (83 kg)    Physical Exam Vitals  reviewed.  HENT:     Head: Normocephalic and atraumatic.  Eyes:     Pupils: Pupils are equal, round, and reactive to light.  Cardiovascular:     Rate and Rhythm: Normal rate and regular rhythm.     Heart sounds: Normal heart sounds.  Pulmonary:     Effort: Pulmonary effort is normal.     Breath sounds: Normal breath sounds.  Abdominal:     General: Bowel sounds are normal.     Palpations: Abdomen is soft.  Musculoskeletal:        General: No tenderness or deformity. Normal range of motion.     Cervical back: Normal range of motion.  Lymphadenopathy:     Cervical: No cervical adenopathy.  Skin:    General: Skin is warm and dry.     Findings: No erythema or rash.  Neurological:     Mental Status: He is alert and oriented to person, place, and time.  Psychiatric:        Behavior: Behavior normal.        Thought Content: Thought content normal.        Judgment: Judgment normal.     Lab Results  Component Value Date   WBC 5.1 10/10/2023   HGB 12.6 (L) 10/10/2023   HCT 38.1 (L) 10/10/2023   MCV 89.4 10/10/2023   PLT 152 10/10/2023     Chemistry      Component Value Date/Time   NA 139 10/10/2023 0935   K 4.7 10/10/2023 0935   CL 108 10/10/2023 0935   CO2 24 10/10/2023 0935   BUN 33 (H) 10/10/2023 0935   CREATININE 2.17 (H) 10/10/2023 0935      Component Value Date/Time   CALCIUM 9.3 10/10/2023 0935   ALKPHOS 86 10/10/2023 0935   AST 16 10/10/2023 0935   ALT 14 10/10/2023 0935   BILITOT 0.7 10/10/2023 0935     Impression and Plan: Michael Knox is a very nice 77 year old white male.  He has a history of recurrent clear-cell carcinoma of the kidney.  He had a early stage kidney cancer of the right kidney back in 2013.  He had a partial nephrectomy.  He subsequently had a recurrence.  This was treated with radiation therapy.  Again, his CT scan looks relatively stable overall.  There was 1 nodule of right lung that was little bit larger.  Again, he may be SBRT will be a  viable treatment for this.  I think this would be a very good idea for him.  We will go ahead with his fifth cycle of treatment.  I will go ahead and plan to get  him back in another 3 weeks.  If we can get things stable, then maybe we can move his treatments out to every 6 weeks.  Ivor Mars, MD 2/10/202510:33 AM

## 2023-10-11 ENCOUNTER — Other Ambulatory Visit: Payer: Self-pay

## 2023-10-13 ENCOUNTER — Other Ambulatory Visit: Payer: Self-pay

## 2023-10-13 ENCOUNTER — Telehealth: Payer: Self-pay | Admitting: Radiation Oncology

## 2023-10-13 NOTE — Telephone Encounter (Signed)
LVM for pt's daughter (as advised by Dr. Myna Hidalgo) to schedule CON with Dr. Roselind Messier for pt.

## 2023-10-13 NOTE — Telephone Encounter (Signed)
Returned pt's daughter's call to schedule CON with Dr. Roselind Messier as requested by Dr. Myna Hidalgo.

## 2023-10-13 NOTE — Telephone Encounter (Signed)
Michael Knox for further assistance.

## 2023-10-20 NOTE — Progress Notes (Signed)
 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 Myna Hidalgo     Pain issues, if any:  Right side from Kidney surgery.    Tobacco/Marijuana/Snuff/ETOH use: Former smoker, quit 2017.  Signs/Symptoms Weight changes, if any: Stable  Respiratory complaints, if any: No Hemoptysis, if any: No   SAFETY ISSUES: Prior radiation? Renal Cell Carcinoma retroperitoneal mass 5/22-02/01/2022, 50 Gy in 10 fractions of 5 Gy with Dr. Kathrynn Running. Pacemaker/ICD? No Possible current pregnancy? no Is the patient on methotrexate? No  Current Complaints / other details:    Port Insertion 08/01/2023

## 2023-10-23 NOTE — Progress Notes (Signed)
 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 A diagnosis of Renal cell cancer, unspecified laterality (HCC) was also pertinent to this visit.  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 male who is accompanied by his supportive wife and daughter. He is seen as a courtesy of Dr. Myna Hidalgo for an opinion concerning radiation therapy as part of management for his recently diagnosed right lung nodule.   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 Lindley Magnus. He was followed under close surveillance with repeat imaging until 2016 without evidence of disease recurrence or progression. In the setting of a history of AAA, he presented for routine CT CAP on 08/18/2021 which showed evidence of disease recurrence, characterized by: a 7.3 cm retroperitoneal mass abutting/involving the infrarenal IVC and a 5 mm right upper lobe pulmonary nodule. He was promptly referred to Dr. 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   He then established care with Dr. Laverle Patter who recommended a chest CT to further evaluate the RUL nodule, as well as an MRI abdomen for further assessment of the proximity of the  retroperitoneal lesion to the IVC to determine whether he would be a surgical candidate or if would be better served with radiotherapy. His subsequent chest CT on 12/07/21 surprisingly showed no evidence of previously demonstrated RUL nodule, and no new or progressive findings overall in the chest. Stability of the AAA was also demonstrated. MRI of the abdomen performed on 12/15/21 demonstrated interval stability of the right retroperitoneal mass consistent with metastatic lymphadenopathy, measuring approximately 7.3 cm, as well as stability of the 4.7 cm infrarenal abdominal aortic aneurysm.   He was then referred to Dr. Kathrynn Running and opted to undergo radiation therapy to the retroperitoneal mass which he completed on 02/01/2022 (see treatment details below). He was also referred to Dr. Welton Flakes [facility]) around that time for consideration of systemic therapy which he ultimately declined.   PET scan on 03/25/22 demonstrated no significant change in size of the right retroperitoneal nodal recurrence, with associated low level hypermetabolic activity. PET otherwise showed no evidence of progressive metastatic disease elsewhere in the body. Based on PET findings showing evidence of residual disease, he was referred to Dr. Clelia Croft at [facility] to further discuss the role of systemic therapy. The patient once again opted to forego systemic treatment and proceed with repeat imaging studies.   Subsequent restaging PET scan on 07/21/22 demonstrated interval stability of the persistent hypermetabolic partially necrotic right para-aortic node, measuring 5.3 cm, and no new or progressive findings elsewhere in the body concerning for metastatic or progressive disease.   Based on the persistent nature of the mass, and uncertainty of residual disease within the mass, Dr. Clelia Croft again urged the patient to  consider some form of systemic therapy. However, the patient opted to proceed with active  surveillance with periodic monitoring of his tumor.   For further surveillance in the setting of his AAA, the patient had a CTA CAP performed on 10/08/22 which incidentally revealed evidence of pulmonary metastatic disease, characterized by: a new RUL nodule measuring 1.4 cm, an additional new right middle lobe nodule, two new LLL nodules, and a new 5 mm RLL nodule. CT findings also included: stability of the 5.6 cm right paratracheal nodal mass at the level of the inferior pole of the right kidney, and a 1.3 cm enhancing solid lesion in the posterior upper pole of the right kidney worrisome for renal cell carcinoma.   - He also underwent an aortobi-iliac endograft repair of infrarenal abdominal aortic aneurysm shortly after this study on 10/29/22.  For convenience, the patient later established oncologic care with Dr. Myna Hidalgo in March of 2024 for continued surveillance of his disease. He had a repeat CT CAP performed around this time on 12/09/22 which showed interval stability of the bilateral lung nodules (including the 1.2 cm RUL nodule), metastatic right retro-aortic lymph node, and redemonstrated the arterially enhancing posterior right upper pole renal mass measuring up to 1.1 cm compatible with renal cell carcinoma.    Based on his stable imaging findings overall, he continued to active surveillance and remained relatively asymptomatic. His next surveillance CT CAP on 06/01/23 however demonstrated a slight interval increase in size of the RUL nodule measuring 1.6 cm (previously 1.3 cm), and an increase in size of the LLL nodule measuring 10 mm (previously measuring 7 mm). CT otherwise showed stability of the other bilateral pulmonary nodules, and stability of the large retroperitoneal node.    To rule out any additional sites of metastatic disease based on CT findings, an MRI of the brain was performed on 06/20/23 which fortunately demonstrated no evidence of intracranial metastatic disease.    After much discussion with Dr. Myna Hidalgo, the patient agreed to proceed with immunotherapy consisting of Keytruda on 07/18/23.   His most recent restaging CT 09/26/23 stable disease overall other than a moderate increase in size of the dominant RUL pulmonary nodule, compatible with progressive metastatic disease, now measuring 1.9 cm, previously measuring 1.6 cm. Stable findings specifically include: stability of the large retrocaval lymph node with effacement of the IVC, stability of the nonspecific mildly hyperdense 1 cm lesion of the left mid kidney, and stability of an apparent contour irregularity and bandlike density along the right kidney posteriorly (likely postoperative in etiology).   Based on CT findings, Dr. Myna Hidalgo has recommended consideration of radiation therapy to the enlarging RUL nodule which we will discuss in detail today. He is now s/p 5 cycles of Keytruda (given on 10/10/23). He should be returning to Dr. Myna Hidalgo for his 6th cycle later next week.   Patient reports to be doing well overall today. He notes some shortness of breath and a dry cough since he had COVID in January of 2025. He otherwise denies hemoptysis or any other changes in his breathing.    PREVIOUS RADIATION THERAPY: Yes   Diagnosis:  Oligometastatic renal cell carcinoma presenting with a 7.3 cm retroperitoneal mass    Indication for treatment:  Curative, Definitive UHRT      Radiation treatment dates:  01/18/22 - 02/01/22 Site/dose:   The retroperitoneal mass was treated to 50 Gy in 10 fractions of 5 Gy Beams/energy:   The patient was treated using stereotactic body radiotherapy according to  a 3D conformal radiotherapy plan.  Volumetric arc fields were employed to deliver 6 MV X-rays.  Image guidance was performed with per fraction cone beam CT prior to treatment under personal MD supervision.  Immobilization was achieved using BodyFix Pillow.    PAST MEDICAL HISTORY: History reviewed. No pertinent past  medical history.  PAST SURGICAL HISTORY: Past Surgical History:  Procedure Laterality Date   IR IMAGING GUIDED PORT INSERTION  08/01/2023    FAMILY HISTORY: History reviewed. No pertinent family history.  SOCIAL HISTORY:  Social History   Tobacco Use   Smoking status: Former    Current packs/day: 0.00    Average packs/day: 1 pack/day for 30.0 years (30.0 ttl pk-yrs)    Types: Cigarettes    Start date: 28    Quit date: 2017    Years since quitting: 8.1   Smokeless tobacco: Never  Vaping Use   Vaping status: Never Used  Substance Use Topics   Alcohol use: Not Currently   Drug use: Not Currently    ALLERGIES:  Allergies  Allergen Reactions   Atorvastatin Other (See Comments)    Joint pain     Pitavastatin Other (See Comments)    Headache, constipation    Simvastatin Other (See Comments)    Joint pain      MEDICATIONS:  Current Outpatient Medications  Medication Sig Dispense Refill   amLODipine (NORVASC) 10 MG tablet Take 10 mg by mouth daily.     aspirin 81 MG EC tablet Take 1 tablet by mouth daily.     Cholecalciferol 25 MCG (1000 UT) capsule Take by mouth daily.     famotidine (PEPCID) 20 MG tablet Take by mouth daily.     Garlic 1000 MG CAPS Take by mouth daily.     hydrOXYzine (ATARAX) 25 MG tablet Take 1 tablet (25 mg total) by mouth every 6 (six) hours as needed. 60 tablet 2   lidocaine-prilocaine (EMLA) cream Apply 1 Application topically as needed. Place on port one hour prior to port access and cover with plastic wrap 30 g 0   methylPREDNISolone (MEDROL DOSEPAK) 4 MG TBPK tablet Take 6 x 1 day, then 5 one day, 4 the next day, 3, then 2, then 1 21 tablet 2   Omega-3 1000 MG CAPS Take 1 capsule by mouth daily.     ondansetron (ZOFRAN) 8 MG tablet Take 1 tablet (8 mg total) by mouth every 8 (eight) hours as needed for nausea or vomiting. 30 tablet 1   prochlorperazine (COMPAZINE) 10 MG tablet Take 1 tablet (10 mg total) by mouth every 6 (six) hours as  needed for nausea or vomiting. 30 tablet 1   promethazine (PHENERGAN) 25 MG tablet Take 1 tablet (25 mg total) by mouth every 6 (six) hours as needed for nausea or vomiting. 30 tablet 0   rosuvastatin (CRESTOR) 20 MG tablet TAKE 1 TABLET BY MOUTH EVERY DAY FOR CHOLESTEROL     telmisartan (MICARDIS) 80 MG tablet TAKE ONE TABLET BY MOUTH EACH DAY FOR BLOOD PRESSURE     No current facility-administered medications for this encounter.    REVIEW OF SYSTEMS: Notable for that above   PHYSICAL EXAM:  height is 5\' 7"  (1.702 m) and weight is 180 lb 6.4 oz (81.8 kg). His oral temperature is 97.7 F (36.5 C). His blood pressure is 101/47 (abnormal) and his pulse is 62. His respiration is 18 and oxygen saturation is 97%.   General: Alert and oriented, in no acute distress HEENT: Head  is normocephalic. Extraocular movements are intact. Oropharynx is clear. Neck: Neck is supple, no palpable cervical or supraclavicular lymphadenopathy. Heart: Regular in rate and rhythm with no murmurs, rubs, or gallops. Chest: Clear to auscultation bilaterally, with no rhonchi, wheezes, or rales. Abdomen: Soft, nontender, nondistended, with no rigidity or guarding. Extremities: No cyanosis or edema. Lymphatics: see Neck Exam Skin: No concerning lesions. Musculoskeletal: symmetric strength and muscle tone throughout. Neurologic: Cranial nerves II through XII are grossly intact. No obvious focalities. Speech is fluent. Coordination is intact. Psychiatric: Judgment and insight are intact. Affect is appropriate.   ECOG = 0  0 - Asymptomatic (Fully active, able to carry on all predisease activities without restriction)  1 - Symptomatic but completely ambulatory (Restricted in physically strenuous activity but ambulatory and able to carry out work of a light or sedentary nature. For example, light housework, office work)  2 - Symptomatic, <50% in bed during the day (Ambulatory and capable of all self care but unable to  carry out any work activities. Up and about more than 50% of waking hours)  3 - Symptomatic, >50% in bed, but not bedbound (Capable of only limited self-care, confined to bed or chair 50% or more of waking hours)  4 - Bedbound (Completely disabled. Cannot carry on any self-care. Totally confined to bed or chair)  5 - Death   Santiago Glad MM, Creech RH, Tormey DC, et al. [phone]). "Toxicity and response criteria of the [facility]". Am. Evlyn Clines. Oncol. 5 (6): 649-55  LABORATORY DATA:  Lab Results  Component Value Date   WBC 5.1 10/10/2023   HGB 12.6 (L) 10/10/2023   HCT 38.1 (L) 10/10/2023   MCV 89.4 10/10/2023   PLT 152 10/10/2023   NEUTROABS 3.2 10/10/2023   Lab Results  Component Value Date   NA 139 10/10/2023   K 4.7 10/10/2023   CL 108 10/10/2023   CO2 24 10/10/2023   GLUCOSE 99 10/10/2023   BUN 33 (H) 10/10/2023   CREATININE 2.17 (H) 10/10/2023   CALCIUM 9.3 10/10/2023      RADIOGRAPHY: CT CHEST ABDOMEN PELVIS WO CONTRAST Result Date: 09/26/2023 CLINICAL DATA:  Right renal cell carcinoma restaging * Tracking Code: BO * EXAM: CT CHEST, ABDOMEN AND PELVIS WITHOUT CONTRAST TECHNIQUE: Multidetector CT imaging of the chest, abdomen and pelvis was performed following the standard protocol without IV contrast. RADIATION DOSE REDUCTION: This exam was performed according to the departmental dose-optimization program which includes automated exposure control, adjustment of the mA and/or kV according to patient size and/or use of iterative reconstruction technique. COMPARISON:  06/01/2023 FINDINGS: CT CHEST FINDINGS Cardiovascular: Coronary, aortic arch, and branch vessel atherosclerotic vascular disease. Right Port-A-Cath tip: SVC. Mediastinum/Nodes: Unremarkable Lungs/Pleura: Scattered bilateral pulmonary nodules are again observed. Most of these nodules are stable but a dominant 1.9 by 1.5 by 1.6 cm (volume = 2.4 cm^3) right upper lobe nodule previously measured 1.6 by 1.2  by 1.4 cm (volume = 1.4 cm^3), moderately increased in size. Musculoskeletal: Thoracic spondylosis. CT ABDOMEN PELVIS FINDINGS Hepatobiliary: Unremarkable Pancreas: Unremarkable Spleen: Unremarkable Adrenals/Urinary Tract: Both adrenal glands appear normal. Stable contour irregularity in bandlike density along the right kidney posteriorly. Stable mildly hyperdense 1 cm lesion of the left mid kidney, about 69 Hounsfield units, nonspecific for hyperdense cyst versus solid lesion on today's noncontrast exam. No urinary tract calculi. Stomach/Bowel: Unremarkable Vascular/Lymphatic: Atherosclerosis is present, including aortoiliac atherosclerotic disease. Notable atheromatous plaque proximally in the celiac artery SMA. Infrarenal abdominal aortic aneurysm traversed by stent graft  extending into the iliac arteries. Effacement of the IVC by a large retrocaval lymph node measuring 4.1 cm in short axis when account is made for the cava along the anterior margin of this node. This is stable based on my measurements compared to 06/01/2023. Reproductive: Unremarkable Other: Stable mild stranding in the central mesentery. Musculoskeletal: Lower lumbar spondylosis and degenerative disc disease causing impingement at L5-S1 and to a lesser degree at L4-5. IMPRESSION: 1. Moderate increase in size of a dominant right upper lobe pulmonary nodule, compatible with progressive metastatic disease. Most of the lung nodules are stable. 2. Stable large retrocaval lymph node with effacement of the IVC. 3. Stable mildly hyperdense 1 cm lesion of the left mid kidney, nonspecific for hyperdense cyst versus solid lesion on today's noncontrast exam. 4. Stable contour irregularity and bandlike density along the right kidney posteriorly, likely postoperative. 5. Lower lumbar spondylosis and degenerative disc disease causing impingement at L5-S1 and to a lesser degree at L4-5. 6. Aortic atherosclerosis. Aortic Atherosclerosis (ICD10-I70.0).  Electronically Signed   By: Gaylyn Rong M.D.   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 Most recent CT of the chest shows an enlarging RUL nodule, compatible with progressive metastatic disease. Imaging otherwise demonstrates stable disease. Mr. Wainer is a good candidate for 3-5 fractions of stereotactic body radiotherapy (SBRT) to the provide local control to the enlarging lung nodule.   Today, we talked to the patient and family about the findings and work-up thus far.  We discussed the natural history of lung metastases and general treatment, highlighting the role of radiotherapy in the management.  We discussed the available radiation techniques, and focused on the details of logistics and delivery.  We reviewed the anticipated acute and late sequelae associated with radiation in this setting.  The patient was encouraged to ask questions that we answered to the best of our ability. A patient consent form was discussed and signed.  We retained a copy for our records.  The patient would like to proceed with radiation and has been scheduled for CT simulation.  Of note, patient is currently receiving immunotherapy under the care of Dr. Myna Hidalgo. His next infusion is scheduled for 11/02/23. We will confirm with Dr. Myna Hidalgo that his radiation can be given concurrently with his Keytruda. However, these two treatments will not be given on the same day.   PLAN: Patient is scheduled for CT simulation on 10/31/23. Dr. Roselind Messier plans to deliver 54 Gy in 3 fractions at the enlarging RUL nodule.   We look forward to participating in this patient's care.   60 minutes of total time was  spent for this patient encounter, including preparation, face-to-face counseling with the patient and coordination of care, physical exam, and documentation of the encounter.   ------------------------------------------------   Bryan Lemma, PA-C   Billie Lade, PhD, MD   [facility]  Radiation Oncology Direct Dial: [phone]  Fax: [phone] [email or web address]   This document serves as a record of services personally performed by Antony Blackbird, MD and Bryan Lemma, PA-C. It was created on his behalf by Neena Rhymes, a trained medical scribe. The creation of this record is based on the scribe's personal observations and the provider's  statements to them. This document has been checked and approved by the attending provider.

## 2023-10-24 ENCOUNTER — Encounter: Payer: Self-pay | Admitting: Hematology & Oncology

## 2023-10-24 ENCOUNTER — Ambulatory Visit
Admission: RE | Admit: 2023-10-24 | Discharge: 2023-10-24 | Disposition: A | Discharge status: Home / Self Care | Payer: Medicare Other | Source: Ambulatory Visit | Attending: Radiation Oncology | Admitting: Radiation Oncology

## 2023-10-24 ENCOUNTER — Encounter: Payer: Self-pay | Admitting: Radiation Oncology

## 2023-10-24 VITALS — BP 101/47 | HR 62 | Temp 97.7°F | Resp 18 | Ht 67.0 in | Wt 180.4 lb

## 2023-10-24 DIAGNOSIS — I700 Atherosclerosis of aorta: Secondary | ICD-10-CM | POA: Diagnosis not present

## 2023-10-24 DIAGNOSIS — M51379 Other intervertebral disc degeneration, lumbosacral region without mention of lumbar back pain or lower extremity pain: Secondary | ICD-10-CM | POA: Diagnosis not present

## 2023-10-24 DIAGNOSIS — C649 Malignant neoplasm of unspecified kidney, except renal pelvis: Secondary | ICD-10-CM

## 2023-10-24 DIAGNOSIS — Z7982 Long term (current) use of aspirin: Secondary | ICD-10-CM | POA: Diagnosis not present

## 2023-10-24 DIAGNOSIS — C641 Malignant neoplasm of right kidney, except renal pelvis: Secondary | ICD-10-CM | POA: Insufficient documentation

## 2023-10-24 DIAGNOSIS — C7801 Secondary malignant neoplasm of right lung: Secondary | ICD-10-CM | POA: Diagnosis present

## 2023-10-24 DIAGNOSIS — Z8616 Personal history of COVID-19: Secondary | ICD-10-CM | POA: Insufficient documentation

## 2023-10-24 DIAGNOSIS — I7143 Infrarenal abdominal aortic aneurysm, without rupture: Secondary | ICD-10-CM | POA: Insufficient documentation

## 2023-10-24 DIAGNOSIS — M4726 Other spondylosis with radiculopathy, lumbar region: Secondary | ICD-10-CM | POA: Insufficient documentation

## 2023-10-24 DIAGNOSIS — Z79899 Other long term (current) drug therapy: Secondary | ICD-10-CM | POA: Diagnosis not present

## 2023-10-24 DIAGNOSIS — Z923 Personal history of irradiation: Secondary | ICD-10-CM | POA: Insufficient documentation

## 2023-10-24 DIAGNOSIS — M5116 Intervertebral disc disorders with radiculopathy, lumbar region: Secondary | ICD-10-CM | POA: Insufficient documentation

## 2023-10-24 DIAGNOSIS — M47814 Spondylosis without myelopathy or radiculopathy, thoracic region: Secondary | ICD-10-CM | POA: Insufficient documentation

## 2023-10-24 DIAGNOSIS — Z87891 Personal history of nicotine dependence: Secondary | ICD-10-CM | POA: Diagnosis not present

## 2023-10-25 ENCOUNTER — Other Ambulatory Visit: Payer: Self-pay | Admitting: *Deleted

## 2023-10-25 ENCOUNTER — Encounter: Payer: Self-pay | Admitting: Hematology & Oncology

## 2023-10-25 MED ORDER — METHYLPREDNISOLONE 4 MG PO TBPK: ORAL_TABLET | ORAL | 2 refills | Status: DC

## 2023-10-31 ENCOUNTER — Ambulatory Visit
Admission: RE | Admit: 2023-10-31 | Discharge: 2023-10-31 | Disposition: A | Discharge status: Home / Self Care | Payer: Medicare Other | Source: Ambulatory Visit | Attending: Radiation Oncology | Admitting: Radiation Oncology

## 2023-10-31 DIAGNOSIS — Z510 Encounter for antineoplastic radiation therapy: Secondary | ICD-10-CM | POA: Insufficient documentation

## 2023-10-31 DIAGNOSIS — C641 Malignant neoplasm of right kidney, except renal pelvis: Secondary | ICD-10-CM | POA: Diagnosis present

## 2023-10-31 DIAGNOSIS — C649 Malignant neoplasm of unspecified kidney, except renal pelvis: Secondary | ICD-10-CM

## 2023-11-01 ENCOUNTER — Inpatient Hospital Stay: Payer: Medicare Other | Admitting: Hematology & Oncology

## 2023-11-01 ENCOUNTER — Encounter: Payer: Self-pay | Admitting: Hematology & Oncology

## 2023-11-01 ENCOUNTER — Inpatient Hospital Stay: Payer: Medicare Other

## 2023-11-01 ENCOUNTER — Inpatient Hospital Stay: Payer: Medicare Other | Attending: Hematology & Oncology

## 2023-11-01 ENCOUNTER — Other Ambulatory Visit: Payer: Self-pay

## 2023-11-01 VITALS — BP 123/86 | HR 62 | Temp 98.1°F | Resp 16 | Ht 67.0 in | Wt 180.0 lb

## 2023-11-01 DIAGNOSIS — Z79899 Other long term (current) drug therapy: Secondary | ICD-10-CM | POA: Diagnosis not present

## 2023-11-01 DIAGNOSIS — C649 Malignant neoplasm of unspecified kidney, except renal pelvis: Secondary | ICD-10-CM

## 2023-11-01 DIAGNOSIS — R21 Rash and other nonspecific skin eruption: Secondary | ICD-10-CM | POA: Insufficient documentation

## 2023-11-01 DIAGNOSIS — Z79620 Long term (current) use of immunosuppressive biologic: Secondary | ICD-10-CM | POA: Diagnosis not present

## 2023-11-01 DIAGNOSIS — C641 Malignant neoplasm of right kidney, except renal pelvis: Secondary | ICD-10-CM | POA: Diagnosis present

## 2023-11-01 DIAGNOSIS — C786 Secondary malignant neoplasm of retroperitoneum and peritoneum: Secondary | ICD-10-CM | POA: Diagnosis not present

## 2023-11-01 DIAGNOSIS — C7801 Secondary malignant neoplasm of right lung: Secondary | ICD-10-CM | POA: Diagnosis present

## 2023-11-01 DIAGNOSIS — Z905 Acquired absence of kidney: Secondary | ICD-10-CM | POA: Insufficient documentation

## 2023-11-01 LAB — CBC WITH DIFFERENTIAL (CANCER CENTER ONLY)
Abs Immature Granulocytes: 0.05 10*3/uL (ref 0.00–0.07)
Basophils Absolute: 0 10*3/uL (ref 0.0–0.1)
Basophils Relative: 1 %
Eosinophils Absolute: 0.4 10*3/uL (ref 0.0–0.5)
Eosinophils Relative: 7 %
HCT: 37.9 % — ABNORMAL LOW (ref 39.0–52.0)
Hemoglobin: 12.9 g/dL — ABNORMAL LOW (ref 13.0–17.0)
Immature Granulocytes: 1 %
Lymphocytes Relative: 15 %
Lymphs Abs: 1 10*3/uL (ref 0.7–4.0)
MCH: 29.9 pg (ref 26.0–34.0)
MCHC: 34 g/dL (ref 30.0–36.0)
MCV: 87.9 fL (ref 80.0–100.0)
Monocytes Absolute: 0.7 10*3/uL (ref 0.1–1.0)
Monocytes Relative: 10 %
Neutro Abs: 4.4 10*3/uL (ref 1.7–7.7)
Neutrophils Relative %: 66 %
Platelet Count: 146 10*3/uL — ABNORMAL LOW (ref 150–400)
RBC: 4.31 MIL/uL (ref 4.22–5.81)
RDW: 13.9 % (ref 11.5–15.5)
WBC Count: 6.6 10*3/uL (ref 4.0–10.5)
nRBC: 0 % (ref 0.0–0.2)

## 2023-11-01 LAB — CMP (CANCER CENTER ONLY)
ALT: 25 U/L (ref 0–44)
AST: 16 U/L (ref 15–41)
Albumin: 4.2 g/dL (ref 3.5–5.0)
Alkaline Phosphatase: 81 U/L (ref 38–126)
Anion gap: 8 (ref 5–15)
BUN: 37 mg/dL — ABNORMAL HIGH (ref 8–23)
CO2: 22 mmol/L (ref 22–32)
Calcium: 9.1 mg/dL (ref 8.9–10.3)
Chloride: 109 mmol/L (ref 98–111)
Creatinine: 1.8 mg/dL — ABNORMAL HIGH (ref 0.61–1.24)
GFR, Estimated: 38 mL/min — ABNORMAL LOW (ref >60)
Glucose, Bld: 189 mg/dL — ABNORMAL HIGH (ref 70–99)
Potassium: 4 mmol/L (ref 3.5–5.1)
Sodium: 139 mmol/L (ref 135–145)
Total Bilirubin: 0.5 mg/dL (ref 0.0–1.2)
Total Protein: 6.8 g/dL (ref 6.5–8.1)

## 2023-11-01 LAB — TSH: TSH: 1.748 u[IU]/mL (ref 0.350–4.500)

## 2023-11-01 LAB — LACTATE DEHYDROGENASE: LDH: 157 U/L (ref 98–192)

## 2023-11-01 MED ORDER — SODIUM CHLORIDE 0.9% FLUSH
10.0000 mL | INTRAVENOUS | Status: AC | PRN
Start: 2023-11-01 — End: ?
  Administered 2023-11-01: 10 mL via INTRAVENOUS

## 2023-11-01 MED ORDER — HEPARIN SOD (PORK) LOCK FLUSH 100 UNIT/ML IV SOLN
500.0000 [IU] | Freq: Once | INTRAVENOUS | Status: AC
  Administered 2023-11-01: 500 [IU] via INTRAVENOUS

## 2023-11-01 NOTE — Patient Instructions (Signed)

## 2023-11-01 NOTE — Progress Notes (Signed)
 Hematology and Oncology Follow Up Visit  Michael Knox [id number] 07/25/1946 78 y.o. 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  He was having some problems with pruritus.  The Medrol Dosepak seem to help right a bit.  He is also a little bit of a rash.  I suspect close this is from the immunotherapy.  He did see Dr. Roselind Messier of Radiation Oncology.  Dr. Roselind Messier is set him up with radiosurgery for the right upper lobe nodule.  I think this will start next week.  He is post get 3-5 treatments.  I really think that we have to give Michael Knox few weeks off.  I think this would really.  Overall, his appetite is doing okay.  He has had no bleeding.  He has had no fever.  He has had no headache.  Overall, his performance status is probably ECOG 1.   Medications:  Current Outpatient Medications:    amLODipine (NORVASC) 10 MG tablet, Take 10 mg by mouth daily., Disp: , Rfl:    aspirin 81 MG EC tablet, Take 1 tablet by mouth daily., Disp: , Rfl:    Cholecalciferol 25 MCG (1000 UT) capsule, Take by mouth daily., Disp: , Rfl:    famotidine (PEPCID) 20 MG tablet, Take by mouth daily., Disp: , Rfl:    Garlic 1000 MG CAPS, Take by mouth daily., Disp: , Rfl:    hydrOXYzine (ATARAX) 25 MG tablet, Take 1 tablet (25 mg total) by mouth every 6 (six) hours as needed., Disp: 60 tablet, Rfl: 2   lidocaine-prilocaine (EMLA) cream, Apply 1 Application topically as needed. Place on port one hour prior to port access and cover with plastic wrap, Disp: 30 g, Rfl: 0   methylPREDNISolone (MEDROL DOSEPAK) 4 MG TBPK tablet, Take 6 x 1 day, then 5 one day, 4 the next day, 3, then 2, then 1, Disp: 21 tablet, Rfl: 2   Omega-3 1000 MG CAPS, Take 1 capsule by mouth daily., Disp: ,  Rfl:    ondansetron (ZOFRAN) 8 MG tablet, Take 1 tablet (8 mg total) by mouth every 8 (eight) hours as needed for nausea or vomiting., Disp: 30 tablet, Rfl: 1   prochlorperazine (COMPAZINE) 10 MG tablet, Take 1 tablet (10 mg total) by mouth every 6 (six) hours as needed for nausea or vomiting., Disp: 30 tablet, Rfl: 1   promethazine (PHENERGAN) 25 MG tablet, Take 1 tablet (25 mg total) by mouth every 6 (six) hours as needed for nausea or vomiting., Disp: 30 tablet, Rfl: 0   rosuvastatin (CRESTOR) 20 MG tablet, TAKE 1 TABLET BY MOUTH EVERY DAY FOR CHOLESTEROL, Disp: , Rfl:    telmisartan (MICARDIS) 80 MG tablet, TAKE ONE TABLET BY MOUTH EACH DAY FOR BLOOD PRESSURE, Disp: , Rfl:   Allergies:  Allergies  Allergen Reactions   Atorvastatin Other (See Comments)    Joint pain     Pitavastatin Other (See Comments)    Headache, constipation    Simvastatin Other (See Comments)    Joint pain      Past Medical History, Surgical history, Social history, and Family History were reviewed and updated.  Review of Systems: Review of Systems  Constitutional: Negative.   HENT:  Negative.    Eyes: Negative.   Respiratory:  Negative.    Cardiovascular: Negative.   Gastrointestinal: Negative.   Endocrine: Negative.   Genitourinary: Negative.    Musculoskeletal: Negative.   Skin: Negative.   Neurological: Negative.   Hematological: Negative.   Psychiatric/Behavioral: Negative.      Physical Exam:  height is 5\' 7"  (1.702 m) and weight is 180 lb (81.6 kg). His oral temperature is 98.1 F (36.7 C). His blood pressure is 123/86 and his pulse is 62. His respiration is 16 and oxygen saturation is 100%.   Wt Readings from Last 3 Encounters:  11/01/23 180 lb (81.6 kg)  10/24/23 180 lb 6.4 oz (81.8 kg)  10/10/23 185 lb (83.9 kg)    Physical Exam Vitals reviewed.  HENT:     Head: Normocephalic and atraumatic.  Eyes:     Pupils: Pupils are equal, round, and reactive to light.  Cardiovascular:      Rate and Rhythm: Normal rate and regular rhythm.     Heart sounds: Normal heart sounds.  Pulmonary:     Effort: Pulmonary effort is normal.     Breath sounds: Normal breath sounds.  Abdominal:     General: Bowel sounds are normal.     Palpations: Abdomen is soft.  Musculoskeletal:        General: No tenderness or deformity. Normal range of motion.     Cervical back: Normal range of motion.  Lymphadenopathy:     Cervical: No cervical adenopathy.  Skin:    General: Skin is warm and dry.     Findings: No erythema or rash.  Neurological:     Mental Status: He is alert and oriented to person, place, and time.  Psychiatric:        Behavior: Behavior normal.        Thought Content: Thought content normal.        Judgment: Judgment normal.      Lab Results  Component Value Date   WBC 6.6 11/01/2023   HGB 12.9 (L) 11/01/2023   HCT 37.9 (L) 11/01/2023   MCV 87.9 11/01/2023   PLT 146 (L) 11/01/2023     Chemistry      Component Value Date/Time   NA 139 11/01/2023 0820   K 4.0 11/01/2023 0820   CL 109 11/01/2023 0820   CO2 22 11/01/2023 0820   BUN 37 (H) 11/01/2023 0820   CREATININE 1.80 (H) 11/01/2023 0820      Component Value Date/Time   CALCIUM 9.1 11/01/2023 0820   ALKPHOS 81 11/01/2023 0820   AST 16 11/01/2023 0820   ALT 25 11/01/2023 0820   BILITOT 0.5 11/01/2023 0820     Impression and Plan: Michael Knox is a very nice 78 year old white male.  He has a history of recurrent clear-cell carcinoma of the kidney.  He had a early stage kidney cancer of the right kidney back in 2013.  He had a partial nephrectomy.  He subsequently had a recurrence.  This was treated with radiation therapy.  We now have him on radiotherapy.  So far, he has done nicely.  He has had stable disease.  The only change was in the right upper lobe nodule.  Again, radiosurgery should help with this.  I will give him 3 weeks off.  We will get him back after his radiosurgery.  I think this will  really help him. Marland Kitchen  Michael Macho, MD 3/4/20259:02 AM

## 2023-11-01 NOTE — Progress Notes (Signed)
No treatment today per order of Dr. Marin Olp.

## 2023-11-01 NOTE — Addendum Note (Signed)
 Addended by: Margaretha Seeds on: 11/01/2023 09:10 AM   Modules accepted: Orders

## 2023-11-02 DIAGNOSIS — Z510 Encounter for antineoplastic radiation therapy: Secondary | ICD-10-CM | POA: Diagnosis not present

## 2023-11-03 ENCOUNTER — Other Ambulatory Visit: Payer: Self-pay

## 2023-11-09 ENCOUNTER — Other Ambulatory Visit: Payer: Self-pay

## 2023-11-09 ENCOUNTER — Ambulatory Visit
Admission: RE | Admit: 2023-11-09 | Discharge: 2023-11-09 | Disposition: A | Discharge status: Home / Self Care | Payer: Medicare Other | Source: Ambulatory Visit | Attending: Radiation Oncology | Admitting: Radiation Oncology

## 2023-11-09 DIAGNOSIS — Z510 Encounter for antineoplastic radiation therapy: Secondary | ICD-10-CM | POA: Diagnosis not present

## 2023-11-09 DIAGNOSIS — C649 Malignant neoplasm of unspecified kidney, except renal pelvis: Secondary | ICD-10-CM

## 2023-11-09 LAB — RAD ONC ARIA SESSION SUMMARY
Course Elapsed Days: 0
Plan Fractions Treated to Date: 1
Plan Prescribed Dose Per Fraction: 18 Gy
Plan Total Fractions Prescribed: 3
Plan Total Prescribed Dose: 54 Gy
Reference Point Dosage Given to Date: 18 Gy
Reference Point Session Dosage Given: 18 Gy
Session Number: 1

## 2023-11-10 ENCOUNTER — Ambulatory Visit: Payer: Medicare Other

## 2023-11-11 ENCOUNTER — Other Ambulatory Visit: Payer: Self-pay

## 2023-11-11 ENCOUNTER — Ambulatory Visit
Admission: RE | Admit: 2023-11-11 | Discharge: 2023-11-11 | Disposition: A | Discharge status: Home / Self Care | Payer: Medicare Other | Source: Ambulatory Visit | Attending: Radiation Oncology | Admitting: Radiation Oncology

## 2023-11-11 DIAGNOSIS — Z510 Encounter for antineoplastic radiation therapy: Secondary | ICD-10-CM | POA: Diagnosis not present

## 2023-11-11 LAB — RAD ONC ARIA SESSION SUMMARY
Course Elapsed Days: 2
Plan Fractions Treated to Date: 2
Plan Prescribed Dose Per Fraction: 18 Gy
Plan Total Fractions Prescribed: 3
Plan Total Prescribed Dose: 54 Gy
Reference Point Dosage Given to Date: 36 Gy
Reference Point Session Dosage Given: 18 Gy
Session Number: 2

## 2023-11-14 ENCOUNTER — Other Ambulatory Visit: Payer: Self-pay

## 2023-11-14 ENCOUNTER — Ambulatory Visit
Admission: RE | Admit: 2023-11-14 | Discharge: 2023-11-14 | Disposition: A | Discharge status: Home / Self Care | Source: Ambulatory Visit | Attending: Radiation Oncology | Admitting: Radiation Oncology

## 2023-11-14 ENCOUNTER — Ambulatory Visit
Admission: RE | Admit: 2023-11-14 | Discharge: 2023-11-14 | Disposition: A | Discharge status: Home / Self Care | Payer: Medicare Other | Source: Ambulatory Visit | Attending: Radiation Oncology | Admitting: Radiation Oncology

## 2023-11-14 DIAGNOSIS — Z510 Encounter for antineoplastic radiation therapy: Secondary | ICD-10-CM | POA: Diagnosis not present

## 2023-11-14 DIAGNOSIS — C649 Malignant neoplasm of unspecified kidney, except renal pelvis: Secondary | ICD-10-CM

## 2023-11-14 LAB — RAD ONC ARIA SESSION SUMMARY
Course Elapsed Days: 5
Plan Fractions Treated to Date: 3
Plan Prescribed Dose Per Fraction: 18 Gy
Plan Total Fractions Prescribed: 3
Plan Total Prescribed Dose: 54 Gy
Reference Point Dosage Given to Date: 54 Gy
Reference Point Session Dosage Given: 18 Gy
Session Number: 3

## 2023-11-15 ENCOUNTER — Ambulatory Visit: Payer: Medicare Other

## 2023-11-15 NOTE — Radiation Completion Notes (Signed)
  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 She continued to experience a dry, intermittent cough throughout the treatment.   The patient will return in one month and will continue follow up with Dr. Myna Hidalgo as well.      Joyice Faster, PA-C

## 2023-11-16 ENCOUNTER — Ambulatory Visit: Payer: Medicare Other | Admitting: Radiation Oncology

## 2023-11-18 ENCOUNTER — Ambulatory Visit: Payer: Medicare Other | Admitting: Radiation Oncology

## 2023-11-21 ENCOUNTER — Encounter: Payer: Self-pay | Admitting: Hematology & Oncology

## 2023-11-21 ENCOUNTER — Other Ambulatory Visit: Payer: Self-pay

## 2023-11-21 ENCOUNTER — Inpatient Hospital Stay: Payer: Medicare Other

## 2023-11-21 ENCOUNTER — Telehealth: Payer: Self-pay | Admitting: Pharmacy Technician

## 2023-11-21 ENCOUNTER — Inpatient Hospital Stay (HOSPITAL_BASED_OUTPATIENT_CLINIC_OR_DEPARTMENT_OTHER): Payer: Medicare Other | Admitting: Hematology & Oncology

## 2023-11-21 ENCOUNTER — Other Ambulatory Visit (HOSPITAL_COMMUNITY): Payer: Self-pay

## 2023-11-21 VITALS — BP 108/55 | HR 64 | Temp 97.9°F | Resp 19 | Ht 67.0 in | Wt 179.0 lb

## 2023-11-21 DIAGNOSIS — C649 Malignant neoplasm of unspecified kidney, except renal pelvis: Secondary | ICD-10-CM

## 2023-11-21 DIAGNOSIS — Z95828 Presence of other vascular implants and grafts: Secondary | ICD-10-CM

## 2023-11-21 DIAGNOSIS — C641 Malignant neoplasm of right kidney, except renal pelvis: Secondary | ICD-10-CM | POA: Diagnosis not present

## 2023-11-21 LAB — CBC WITH DIFFERENTIAL (CANCER CENTER ONLY)
Abs Immature Granulocytes: 0.01 10*3/uL (ref 0.00–0.07)
Basophils Absolute: 0.1 10*3/uL (ref 0.0–0.1)
Basophils Relative: 2 %
Eosinophils Absolute: 0.4 10*3/uL (ref 0.0–0.5)
Eosinophils Relative: 11 %
HCT: 38.3 % — ABNORMAL LOW (ref 39.0–52.0)
Hemoglobin: 12.7 g/dL — ABNORMAL LOW (ref 13.0–17.0)
Immature Granulocytes: 0 %
Lymphocytes Relative: 21 %
Lymphs Abs: 0.8 10*3/uL (ref 0.7–4.0)
MCH: 29.5 pg (ref 26.0–34.0)
MCHC: 33.2 g/dL (ref 30.0–36.0)
MCV: 89.1 fL (ref 80.0–100.0)
Monocytes Absolute: 0.4 10*3/uL (ref 0.1–1.0)
Monocytes Relative: 11 %
Neutro Abs: 2.2 10*3/uL (ref 1.7–7.7)
Neutrophils Relative %: 55 %
Platelet Count: 168 10*3/uL (ref 150–400)
RBC: 4.3 MIL/uL (ref 4.22–5.81)
RDW: 13.6 % (ref 11.5–15.5)
WBC Count: 3.9 10*3/uL — ABNORMAL LOW (ref 4.0–10.5)
nRBC: 0 % (ref 0.0–0.2)

## 2023-11-21 LAB — CMP (CANCER CENTER ONLY)
ALT: 12 U/L (ref 0–44)
AST: 15 U/L (ref 15–41)
Albumin: 4.4 g/dL (ref 3.5–5.0)
Alkaline Phosphatase: 78 U/L (ref 38–126)
Anion gap: 7 (ref 5–15)
BUN: 29 mg/dL — ABNORMAL HIGH (ref 8–23)
CO2: 25 mmol/L (ref 22–32)
Calcium: 9 mg/dL (ref 8.9–10.3)
Chloride: 108 mmol/L (ref 98–111)
Creatinine: 2.16 mg/dL — ABNORMAL HIGH (ref 0.61–1.24)
GFR, Estimated: 30 mL/min — ABNORMAL LOW (ref >60)
Glucose, Bld: 140 mg/dL — ABNORMAL HIGH (ref 70–99)
Potassium: 4.2 mmol/L (ref 3.5–5.1)
Sodium: 140 mmol/L (ref 135–145)
Total Bilirubin: 0.6 mg/dL (ref 0.0–1.2)
Total Protein: 7 g/dL (ref 6.5–8.1)

## 2023-11-21 LAB — TSH: TSH: 1.71 u[IU]/mL (ref 0.350–4.500)

## 2023-11-21 LAB — LACTATE DEHYDROGENASE: LDH: 161 U/L (ref 98–192)

## 2023-11-21 MED ORDER — SODIUM CHLORIDE 0.9% FLUSH
10.0000 mL | Freq: Once | INTRAVENOUS | Status: AC
  Administered 2023-11-21: 10 mL via INTRAVENOUS

## 2023-11-21 MED ORDER — CABOMETYX 40 MG PO TABS
40.0000 mg | ORAL_TABLET | Freq: Every day | ORAL | 4 refills | Status: DC
Start: 2023-11-21 — End: 2024-01-11
  Filled 2023-11-22: qty 30, 30d supply, fill #0
  Filled 2023-12-13: qty 30, 30d supply, fill #1

## 2023-11-21 MED ORDER — HYDROXYZINE HCL 25 MG PO TABS: 25.0000 mg | ORAL_TABLET | Freq: Four times a day (QID) | ORAL | 2 refills | Status: DC | PRN

## 2023-11-21 MED ORDER — HEPARIN SOD (PORK) LOCK FLUSH 100 UNIT/ML IV SOLN
500.0000 [IU] | Freq: Once | INTRAVENOUS | Status: AC
  Administered 2023-11-21: 500 [IU] via INTRAVENOUS

## 2023-11-21 NOTE — Telephone Encounter (Signed)
 Oral Oncology Patient Advocate Encounter   Received notification that prior authorization for Cabometyx is required.   PA submitted on 11/21/2023 Key [id number] Status is pending     Patty Almedia Balls, CPhT Oncology Pharmacy Patient Advocate [facility] Direct Number: [phone] Fax: [phone]

## 2023-11-21 NOTE — Progress Notes (Signed)
 Hematology and Oncology Follow Up Visit  Michael Knox [id number] Sep 19, 1945 78 y.o. 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  He has a little bit of a cough.  He completed the stereotactic radiosurgery to the right upper lobe nodule last week.  So far, I think is doing well with this.  Still has some pruritus.  There is no rash.  I suspect this probably is still from the immunotherapy.  Again have to make a change with his immunotherapy.  We will have to I think and try him on Cabometyx.  I think this would be a good idea for him.  I will put him on 40 mg p.o. daily.  His appetite is doing okay.  He has had no nausea or vomiting.  He has had no change in bowel or bladder habits.  He has occasional diarrhea.  He has had no bleeding.  There is been no rashes.  He has had no leg swelling.  Currently, I would have said that his performance status is probably ECOG 2.     Medications:  Current Outpatient Medications:    amLODipine (NORVASC) 10 MG tablet, Take 10 mg by mouth daily., Disp: , Rfl:    aspirin 81 MG EC tablet, Take 1 tablet by mouth daily., Disp: , Rfl:    Cholecalciferol 25 MCG (1000 UT) capsule, Take by mouth daily., Disp: , Rfl:    famotidine (PEPCID) 20 MG tablet, Take by mouth daily., Disp: , Rfl:    Garlic 1000 MG CAPS, Take by mouth daily., Disp: , Rfl:    hydrOXYzine (ATARAX) 25 MG tablet, Take 1 tablet (25 mg total) by mouth every 6 (six) hours as needed., Disp: 60 tablet, Rfl: 2   lidocaine-prilocaine (EMLA) cream, Apply 1 Application topically as needed. Place on port  one hour prior to port access and cover with plastic wrap, Disp: 30 g, Rfl: 0   methylPREDNISolone (MEDROL DOSEPAK) 4 MG TBPK tablet, Take 6 x 1 day, then 5 one day, 4 the next day, 3, then 2, then 1, Disp: 21 tablet, Rfl: 2   Omega-3 1000 MG CAPS, Take 1 capsule by mouth daily., Disp: , Rfl:    ondansetron (ZOFRAN) 8 MG tablet, Take 1 tablet (8 mg total) by mouth every 8 (eight) hours as needed for nausea or vomiting., Disp: 30 tablet, Rfl: 1   prochlorperazine (COMPAZINE) 10 MG tablet, Take 1 tablet (10 mg total) by mouth every 6 (six) hours as needed for nausea or vomiting., Disp: 30 tablet, Rfl: 1   promethazine (PHENERGAN) 25 MG tablet, Take 1 tablet (25 mg total) by mouth every 6 (six) hours as needed for nausea or vomiting., Disp: 30 tablet, Rfl: 0   rosuvastatin (CRESTOR) 20 MG tablet, TAKE 1 TABLET BY MOUTH EVERY DAY FOR CHOLESTEROL, Disp: , Rfl:    telmisartan (MICARDIS) 80 MG tablet, TAKE ONE TABLET BY MOUTH EACH DAY FOR BLOOD PRESSURE, Disp: , Rfl:  No current facility-administered medications for this visit.  Facility-Administered Medications Ordered in Other Visits:    sodium chloride flush (NS) 0.9 % injection 10 mL,  10 mL, Intravenous, PRN, [name], MD, 10 mL at 11/01/23 1610  Allergies:  Allergies  Allergen Reactions   Atorvastatin Other (See Comments)    Joint pain     Pitavastatin Other (See Comments)    Headache, constipation    Simvastatin Other (See Comments)    Joint pain      Past Medical History, Surgical history, Social history, and Family History were reviewed and updated.  Review of Systems: Review of Systems  Constitutional: Negative.   HENT:  Negative.    Eyes: Negative.   Respiratory: Negative.    Cardiovascular: Negative.   Gastrointestinal: Negative.   Endocrine: Negative.   Genitourinary: Negative.    Musculoskeletal: Negative.   Skin: Negative.   Neurological: Negative.   Hematological: Negative.   Psychiatric/Behavioral:  Negative.      Physical Exam:  height is 5\' 7"  (1.702 m) and weight is 179 lb (81.2 kg). His oral temperature is 97.9 F (36.6 C). His blood pressure is 108/55 (abnormal) and his pulse is 64. His respiration is 19 and oxygen saturation is 99%.   Wt Readings from Last 3 Encounters:  11/21/23 179 lb (81.2 kg)  11/01/23 180 lb (81.6 kg)  10/24/23 180 lb 6.4 oz (81.8 kg)    Physical Exam Vitals reviewed.  HENT:     Head: Normocephalic and atraumatic.  Eyes:     Pupils: Pupils are equal, round, and reactive to light.  Cardiovascular:     Rate and Rhythm: Normal rate and regular rhythm.     Heart sounds: Normal heart sounds.  Pulmonary:     Effort: Pulmonary effort is normal.     Breath sounds: Normal breath sounds.  Abdominal:     General: Bowel sounds are normal.     Palpations: Abdomen is soft.  Musculoskeletal:        General: No tenderness or deformity. Normal range of motion.     Cervical back: Normal range of motion.  Lymphadenopathy:     Cervical: No cervical adenopathy.  Skin:    General: Skin is warm and dry.     Findings: No erythema or rash.  Neurological:     Mental Status: He is alert and oriented to person, place, and time.  Psychiatric:        Behavior: Behavior normal.        Thought Content: Thought content normal.        Judgment: Judgment normal.      Lab Results  Component Value Date   WBC 3.9 (L) 11/21/2023   HGB 12.7 (L) 11/21/2023   HCT 38.3 (L) 11/21/2023   MCV 89.1 11/21/2023   PLT 168 11/21/2023     Chemistry      Component Value Date/Time   NA 140 11/21/2023 0840   K 4.2 11/21/2023 0840   CL 108 11/21/2023 0840   CO2 25 11/21/2023 0840   BUN 29 (H) 11/21/2023 0840   CREATININE 2.16 (H) 11/21/2023 0840      Component Value Date/Time   CALCIUM 9.0 11/21/2023 0840   ALKPHOS 78 11/21/2023 0840   AST 15 11/21/2023 0840   ALT 12 11/21/2023 0840   BILITOT 0.6 11/21/2023 0840     Impression and Plan: Michael Knox is a very nice  78 year old white male.  He has a history of recurrent clear-cell carcinoma of the kidney.  He had a early stage kidney cancer of the right kidney back in 2013.  He had a partial nephrectomy.  He subsequently  had a recurrence.  This was treated with radiation therapy.  We will make the switch over to Cabometyx.  I think this would be very reasonable.  I really think that this is all about quality of life.  We really have to try down of his quality of life.  If the pruritus improves, then his quality of life will improve.  I suspect it probably take about a week for him to finally get the Cabometyx.  I would like to see him back in about a month.  We now have him on radiotherapy.  So far, he has done nicely.  He has had stable disease.  The only change was in the right upper lobe nodule.  Again, radiosurgery should help with this.  I will give him 3 weeks off.  We will get him back after his radiosurgery.  I think this will really help him. Marland Kitchen  [name], MD 3/24/20259:46 AM

## 2023-11-21 NOTE — Telephone Encounter (Signed)
 Oral Oncology Patient Advocate Encounter  Was successful in securing patient a $10,000 grant from [facility] to provide copayment coverage for Cabometyx.  This will keep the out of pocket expense at $0.     [employer] ID: [id number]   The billing information is as follows and has been shared with [facility].    RxBin: [id number] PCN: [id number] Member ID: [id number] Group ID: [id number] Dates of Eligibility: 10/22/2023 through 10/20/2024  Fund:  Renal Cell Carcinoma - Medicare Access  Patty Almedia Balls, CPhT Oncology Pharmacy Patient Advocate [facility] Direct Number: [phone] Fax: [phone]

## 2023-11-21 NOTE — Telephone Encounter (Signed)
 Oral Oncology Patient Advocate Encounter  Prior Authorization for Cabometyx has been approved.    PA# [id number] Effective dates: 11/21/2023 through 08/29/2024  Patients co-pay is $1,904.72.    Patty Almedia Balls, CPhT Oncology Pharmacy Patient Advocate [facility] Direct Number: [phone] Fax: [phone]

## 2023-11-22 ENCOUNTER — Telehealth: Payer: Self-pay | Admitting: Pharmacist

## 2023-11-22 ENCOUNTER — Other Ambulatory Visit: Payer: Self-pay

## 2023-11-22 ENCOUNTER — Other Ambulatory Visit: Payer: Self-pay | Admitting: Pharmacy Technician

## 2023-11-22 ENCOUNTER — Other Ambulatory Visit (HOSPITAL_COMMUNITY): Payer: Self-pay

## 2023-11-22 NOTE — Telephone Encounter (Signed)
 Oral Oncology Pharmacist Encounter  Received new prescription for Cabometyx (cabozantinib) for the treatment of metastatic clear cell renal cell carcinoma, planned duration until disease progression or unacceptable drug toxicity.  CBC w/ Diff and CMP from 11/21/23 assessed, noted patient with Scr of 2.16 mg/dL (CrCl ~46.9 mL/min) - no dose adjustments are required per package insert. Patient BP 108/55 mmHg on 11/21/23. Prescription dose and frequency assessed for appropriateness.  Current medication list in Epic reviewed, no relevant/significant DDIs with Cabometyx identified.  Evaluated chart and no patient barriers to medication adherence noted.   Prescription has been e-scribed to the [facility] for benefits analysis and approval.  Oral Oncology Clinic will continue to follow for insurance authorization, copayment issues, initial counseling and start date.  Sherry Ruffing, PharmD, BCPS, BCOP Hematology/Oncology Clinical Pharmacist [facility] and [facility] Oral Chemotherapy Navigation Clinics [phone] 11/22/2023 9:00 AM

## 2023-11-22 NOTE — Progress Notes (Signed)
 Oral Chemotherapy Pharmacist Encounter  Patient was counseled under telephone encounter from 11/22/23.  Sherry Ruffing, PharmD, BCPS, BCOP Hematology/Oncology Clinical Pharmacist [facility] and [facility] Oral Chemotherapy Navigation Clinics [phone] 11/22/2023 3:09 PM

## 2023-11-22 NOTE — Progress Notes (Signed)
 Specialty Pharmacy Initial Fill Coordination Note  Michael Knox is a 78 y.o. male contacted today regarding refills of specialty medication(s) Cabozantinib S-Malate (Cabometyx) .  Patient requested Delivery  on 11/25/23  to verified address [address] [place] [address]   Medication will be filled on 11/24/2023.   Patient is aware of $0 copayment. Radio producer.  Patty Almedia Balls, CPhT Oncology Pharmacy Patient Advocate [facility] Direct Number: [phone] Fax: [phone]

## 2023-11-22 NOTE — Telephone Encounter (Signed)
 Oral Chemotherapy Pharmacist Encounter  I spoke with patient and patient's wife, Takoda Janowiak, for overview of: Cabometyx (cabozantinib) for the treatment of metastatic clear cell, renal cell carcinoma, planned duration until disease progression or unacceptable toxicity.   Counseled patient on administration, dosing, side effects, monitoring, drug-food interactions, safe handling, storage, and disposal.  Patient will take Cabometyx 40mg  tablets, 1 tablet (40mg ) by mouth once daily on an empty stomach, 1 hour before or 2 hours after a meal.  Patient knows to avoid grapefruit and grapefruit juice.  Cabometyx start date: 11/27/23  Adverse effects include but are not limited to: diarrhea, nausea, decreased appetite, fatigue, hypertension, hand-foot syndrome, decreased blood counts, and electrolyte abnormalities. Diarrhea: Patient will obtain Imodium (loperamide) to have on hand if they experience diarrhea. Patient knows to alert the office of 4 or more loose stools above baseline.  Patient informed that Cabometyx should be held at least 3 weeks prior to any scheduled surgery (including dental surgery) and not resumed until at least 2 weeks after major surgery and until adequate wound healing is established.  Reviewed with patient importance of keeping a medication schedule and plan for any missed doses. No barriers to medication adherence identified.  Medication reconciliation performed and medication/allergy list updated.  All questions answered.  Mr. And Ms. Pileggi voiced understanding and appreciation.   Medication education handout placed in mail for patient. Patient knows to call the office with questions or concerns. Oral Chemotherapy Clinic phone number provided to patient.   Sherry Ruffing, PharmD, BCPS, BCOP Hematology/Oncology Clinical Pharmacist [facility] and [facility] Oral Chemotherapy Navigation Clinics [phone] 11/22/2023 3:07 PM

## 2023-11-24 ENCOUNTER — Other Ambulatory Visit: Payer: Self-pay

## 2023-11-29 ENCOUNTER — Other Ambulatory Visit: Payer: Self-pay

## 2023-12-06 ENCOUNTER — Encounter: Payer: Self-pay | Admitting: Radiation Oncology

## 2023-12-12 ENCOUNTER — Encounter: Payer: Self-pay | Admitting: Radiation Oncology

## 2023-12-12 ENCOUNTER — Ambulatory Visit
Admission: RE | Admit: 2023-12-12 | Discharge: 2023-12-12 | Disposition: A | Discharge status: Home / Self Care | Source: Ambulatory Visit | Attending: Radiation Oncology | Admitting: Radiation Oncology

## 2023-12-12 VITALS — BP 149/61 | HR 63 | Temp 97.0°F | Resp 18 | Ht 67.0 in | Wt 180.6 lb

## 2023-12-12 DIAGNOSIS — Z923 Personal history of irradiation: Secondary | ICD-10-CM | POA: Diagnosis not present

## 2023-12-12 DIAGNOSIS — R059 Cough, unspecified: Secondary | ICD-10-CM | POA: Diagnosis not present

## 2023-12-12 DIAGNOSIS — Z7982 Long term (current) use of aspirin: Secondary | ICD-10-CM | POA: Diagnosis not present

## 2023-12-12 DIAGNOSIS — Z79899 Other long term (current) drug therapy: Secondary | ICD-10-CM | POA: Insufficient documentation

## 2023-12-12 DIAGNOSIS — C7801 Secondary malignant neoplasm of right lung: Secondary | ICD-10-CM | POA: Diagnosis present

## 2023-12-12 DIAGNOSIS — Z905 Acquired absence of kidney: Secondary | ICD-10-CM | POA: Insufficient documentation

## 2023-12-12 DIAGNOSIS — C641 Malignant neoplasm of right kidney, except renal pelvis: Secondary | ICD-10-CM | POA: Insufficient documentation

## 2023-12-12 DIAGNOSIS — C649 Malignant neoplasm of unspecified kidney, except renal pelvis: Secondary | ICD-10-CM

## 2023-12-12 DIAGNOSIS — C786 Secondary malignant neoplasm of retroperitoneum and peritoneum: Secondary | ICD-10-CM | POA: Insufficient documentation

## 2023-12-12 HISTORY — DX: Personal history of irradiation: Z92.3

## 2023-12-12 NOTE — Progress Notes (Signed)
 Radiation Oncology         [phone] ________________________________  Name: Michael Knox MRN: [id number]  Date: 12/12/2023  DOB: 1946-04-14  Follow-Up Visit Note  CC: Michele Ahle, MD  Michele Ahle, MD    ICD-10-CM   1. Secondary malignant neoplasm of right lung (HCC)  C78.01       Diagnosis: Clear-cell carcinoma of the right kidney diagnosed in 2013 s/p partial nephrectomy -- Retroperitoneal recurrence diagnosed in 2023 s/p radiation therapy -- Now with pulmonary metastatic disease and currently on immunotherapy with stable disease other than an increasing RUL nodule   Interval Since Last Radiation: 28 days   Radiation Treatment Dates: First Treatment Date: 2023-11-09 -- Last Treatment Date: 2023-11-14  Intent: Curative  Site/Dose/Technique/Mode:   Plan Name: Lung_R_SBRT Site: Lung, Right Technique: SBRT/SRT-IMRT Mode: Photon Dose Per Fraction: 18 Gy Prescribed Dose (Delivered / Prescribed): 54 Gy / 54 Gy Prescribed Fxs (Delivered / Prescribed): 3 / 3   Narrative:  The patient returns today for a routine 1 month follow-up visit s/p radiation therapy. He tolerated radiation therapy relatively well overall other than intermittent cough throughout treatment.        While undergoing radiation therapy, Dr. Maria Shiner elected to hold his immunotherapy (Pembrolizumab) to give him a break secondary to ongoing issues with pruritus and rash. He was most recently seen Dr. Maria Shiner on 11/21/23, and despite holding his immunotherapy for approximately 3 weeks, his pruritus persisted. Dr. Maria Shiner has subsequently discontinued Pembrolizumab and will start him on Cabometyx (which he began taking on 11/27/23).     No other significant interval history since the patient completed radiation therapy.  Patient notes a dry cough since completing his radiation treatment.  Does not cough has been worsening and has kept him up at night.  He denies any phlegm or hemoptysis with the  cough.  He denies any fevers, chills, chest pain, or shortness of breath.                      Allergies:  is allergic to atorvastatin, pitavastatin, and simvastatin.  Meds: Current Outpatient Medications  Medication Sig Dispense Refill   amLODipine (NORVASC) 10 MG tablet Take 10 mg by mouth daily.     aspirin 81 MG EC tablet Take 1 tablet by mouth daily.     cabozantinib (CABOMETYX) 40 MG tablet Take 1 tablet (40 mg total) by mouth daily. Take on an empty stomach, 1 hour before or 2 hours after meals. 30 tablet 4   Cholecalciferol 25 MCG (1000 UT) capsule Take by mouth daily.     famotidine (PEPCID) 20 MG tablet Take by mouth daily.     Garlic 1000 MG CAPS Take by mouth daily.     hydrOXYzine (ATARAX) 25 MG tablet Take 1 tablet (25 mg total) by mouth every 6 (six) hours as needed. 60 tablet 2   lidocaine-prilocaine (EMLA) cream Apply 1 Application topically as needed. Place on port one hour prior to port access and cover with plastic wrap 30 g 0   methylPREDNISolone (MEDROL DOSEPAK) 4 MG TBPK tablet Take 6 x 1 day, then 5 one day, 4 the next day, 3, then 2, then 1 21 tablet 2   Omega-3 1000 MG CAPS Take 1 capsule by mouth daily.     promethazine (PHENERGAN) 25 MG tablet Take 1 tablet (25 mg total) by mouth every 6 (six) hours as needed for nausea or vomiting. 30 tablet 0   rosuvastatin (CRESTOR)  20 MG tablet TAKE 1 TABLET BY MOUTH EVERY DAY FOR CHOLESTEROL     telmisartan (MICARDIS) 80 MG tablet TAKE ONE TABLET BY MOUTH EACH DAY FOR BLOOD PRESSURE     No current facility-administered medications for this encounter.   Facility-Administered Medications Ordered in Other Encounters  Medication Dose Route Frequency Provider Last Rate Last Admin   sodium chloride flush (NS) 0.9 % injection 10 mL  10 mL Intravenous PRN Ivor Mars, MD   10 mL at 11/01/23 1610    Physical Findings: The patient is in no acute distress. Patient is alert and oriented.  height is 5\' 7"  (1.702 m) and weight is  180 lb 9.6 oz (81.9 kg). His temperature is 97 F (36.1 C) (abnormal). His blood pressure is 149/61 (abnormal) and his pulse is 63. His respiration is 18 and oxygen saturation is 100%. .  No significant changes. Lungs are clear to auscultation bilaterally. Heart has regular rate and rhythm. No palpable cervical, supraclavicular, or axillary adenopathy. Abdomen soft, non-tender, normal bowel sounds.   Lab Findings: Lab Results  Component Value Date   WBC 3.9 (L) 11/21/2023   HGB 12.7 (L) 11/21/2023   HCT 38.3 (L) 11/21/2023   MCV 89.1 11/21/2023   PLT 168 11/21/2023    Radiographic Findings: No results found.  Impression/Plan: Clear-cell carcinoma of the right kidney diagnosed in 2013 s/p partial nephrectomy -- Retroperitoneal recurrence diagnosed in 2023 s/p radiation therapy -- Now with pulmonary metastatic disease and currently on immunotherapy with stable disease other than an increasing RUL nodule; s/p palliative radiation completed on 11/14/2023   The patient is recovering from the effects of radiation.  He presents today with a worsening dry cough, concerning for radiation-induced inflammation to the lung.  We would typically recommend a steroid to help with this, however patient is currently undergoing immunotherapy with Dr. Maria Shiner.  He is scheduled to see Dr. Maria Shiner tomorrow, 12/13/2023.  We will defer the decision to start a steroid to Dr. Maria Shiner.  Patient understands to bring this up to him tomorrow.  We have recommended an OTC cough suppressant to help with symptoms at this time.  Radiation follow-up as needed.  We appreciate the opportunity to take part in this patient's care.  He was encouraged to call with any questions or concerns.   20 minutes of total time was spent for this patient encounter, including preparation, face-to-face counseling with the patient and coordination of care, physical exam, and documentation of the  encounter. ____________________________________   Julio Ohm, PA-C   This document serves as a record of services personally performed by Julio Ohm, PA-C. It was created on his behalf by Aleta Anda, a trained medical scribe. The creation of this record is based on the scribe's personal observations and the provider's statements to them. This document has been checked and approved by the attending provider.

## 2023-12-12 NOTE — Progress Notes (Signed)
 Michael Knox is here today for follow up post radiation to the lung.  Lung Side: Right side  Does the patient complain of any of the following: Pain: 5/10 Back pain to right side. Shortness of breath w/wo exertion: Denies Cough: Yes, he reports coughing throughout the day. Hemoptysis: Denies Pain with swallowing: Denies Swallowing/choking concerns: Denies Appetite: Good Energy Level: Low Post radiation skin Changes: Denies    BP (!) 149/61 (BP Location: Right Arm, Patient Position: Sitting, Cuff Size: Normal)   Pulse 63   Temp (!) 97 F (36.1 C)   Resp 18   Ht 5\' 7"  (1.702 m)   Wt 180 lb 9.6 oz (81.9 kg)   SpO2 100%   BMI 28.29 kg/m

## 2023-12-13 ENCOUNTER — Other Ambulatory Visit (HOSPITAL_COMMUNITY): Payer: Self-pay

## 2023-12-13 ENCOUNTER — Inpatient Hospital Stay

## 2023-12-13 ENCOUNTER — Other Ambulatory Visit: Payer: Self-pay

## 2023-12-13 ENCOUNTER — Ambulatory Visit (HOSPITAL_BASED_OUTPATIENT_CLINIC_OR_DEPARTMENT_OTHER)
Admission: RE | Admit: 2023-12-13 | Discharge: 2023-12-13 | Disposition: A | Discharge status: Home / Self Care | Source: Ambulatory Visit | Attending: Hematology & Oncology | Admitting: Hematology & Oncology

## 2023-12-13 ENCOUNTER — Encounter: Payer: Self-pay | Admitting: Hematology & Oncology

## 2023-12-13 ENCOUNTER — Inpatient Hospital Stay: Attending: Hematology & Oncology

## 2023-12-13 ENCOUNTER — Inpatient Hospital Stay: Admitting: Hematology & Oncology

## 2023-12-13 VITALS — BP 146/62 | HR 59 | Temp 97.9°F | Resp 18 | Ht 67.0 in | Wt 179.0 lb

## 2023-12-13 DIAGNOSIS — C649 Malignant neoplasm of unspecified kidney, except renal pelvis: Secondary | ICD-10-CM | POA: Diagnosis not present

## 2023-12-13 DIAGNOSIS — C799 Secondary malignant neoplasm of unspecified site: Secondary | ICD-10-CM | POA: Insufficient documentation

## 2023-12-13 DIAGNOSIS — Z95828 Presence of other vascular implants and grafts: Secondary | ICD-10-CM

## 2023-12-13 DIAGNOSIS — R918 Other nonspecific abnormal finding of lung field: Secondary | ICD-10-CM | POA: Diagnosis not present

## 2023-12-13 DIAGNOSIS — R058 Other specified cough: Secondary | ICD-10-CM | POA: Diagnosis present

## 2023-12-13 LAB — CBC WITH DIFFERENTIAL (CANCER CENTER ONLY)
Abs Immature Granulocytes: 0.02 10*3/uL (ref 0.00–0.07)
Basophils Absolute: 0.1 10*3/uL (ref 0.0–0.1)
Basophils Relative: 1 %
Eosinophils Absolute: 1 10*3/uL — ABNORMAL HIGH (ref 0.0–0.5)
Eosinophils Relative: 20 %
HCT: 39.9 % (ref 39.0–52.0)
Hemoglobin: 13.4 g/dL (ref 13.0–17.0)
Immature Granulocytes: 0 %
Lymphocytes Relative: 19 %
Lymphs Abs: 1 10*3/uL (ref 0.7–4.0)
MCH: 28.9 pg (ref 26.0–34.0)
MCHC: 33.6 g/dL (ref 30.0–36.0)
MCV: 86 fL (ref 80.0–100.0)
Monocytes Absolute: 0.3 10*3/uL (ref 0.1–1.0)
Monocytes Relative: 6 %
Neutro Abs: 2.7 10*3/uL (ref 1.7–7.7)
Neutrophils Relative %: 54 %
Platelet Count: 135 10*3/uL — ABNORMAL LOW (ref 150–400)
RBC: 4.64 MIL/uL (ref 4.22–5.81)
RDW: 14.1 % (ref 11.5–15.5)
WBC Count: 5.1 10*3/uL (ref 4.0–10.5)
nRBC: 0 % (ref 0.0–0.2)

## 2023-12-13 LAB — CMP (CANCER CENTER ONLY)
ALT: 28 U/L (ref 0–44)
AST: 31 U/L (ref 15–41)
Albumin: 4.4 g/dL (ref 3.5–5.0)
Alkaline Phosphatase: 115 U/L (ref 38–126)
Anion gap: 8 (ref 5–15)
BUN: 30 mg/dL — ABNORMAL HIGH (ref 8–23)
CO2: 24 mmol/L (ref 22–32)
Calcium: 8.8 mg/dL — ABNORMAL LOW (ref 8.9–10.3)
Chloride: 109 mmol/L (ref 98–111)
Creatinine: 1.86 mg/dL — ABNORMAL HIGH (ref 0.61–1.24)
GFR, Estimated: 36 mL/min — ABNORMAL LOW (ref >60)
Glucose, Bld: 99 mg/dL (ref 70–99)
Potassium: 4.5 mmol/L (ref 3.5–5.1)
Sodium: 141 mmol/L (ref 135–145)
Total Bilirubin: 0.8 mg/dL (ref 0.0–1.2)
Total Protein: 6.8 g/dL (ref 6.5–8.1)

## 2023-12-13 LAB — TSH: TSH: 2.758 u[IU]/mL (ref 0.350–4.500)

## 2023-12-13 LAB — LACTATE DEHYDROGENASE: LDH: 287 U/L — ABNORMAL HIGH (ref 98–192)

## 2023-12-13 MED ORDER — HYDROCODONE BIT-HOMATROP MBR 5-1.5 MG/5ML PO SOLN: 5.0000 mL | Freq: Four times a day (QID) | ORAL | 0 refills | Status: DC | PRN

## 2023-12-13 MED ORDER — SODIUM CHLORIDE 0.9% FLUSH
10.0000 mL | Freq: Once | INTRAVENOUS | Status: AC
  Administered 2023-12-13: 10 mL

## 2023-12-13 MED ORDER — HEPARIN SOD (PORK) LOCK FLUSH 100 UNIT/ML IV SOLN
500.0000 [IU] | Freq: Once | INTRAVENOUS | Status: AC
  Administered 2023-12-13: 500 [IU] via INTRAVENOUS

## 2023-12-13 MED ORDER — METHYLPREDNISOLONE 4 MG PO TBPK: ORAL_TABLET | ORAL | 2 refills | Status: DC

## 2023-12-13 MED ORDER — LIDOCAINE 5 % EX PTCH: 1.0000 | MEDICATED_PATCH | CUTANEOUS | 3 refills | Status: DC

## 2023-12-13 NOTE — Progress Notes (Signed)
 Specialty Pharmacy Refill Coordination Note  Spoke with patient's wife, Stephenie Einstein. RMANI KELLOGG is a 78 y.o. male contacted today regarding refills of specialty medication(s) Cabozantinib S-Malate (Cabometyx)   Patient requested Delivery   Delivery date: 12/22/23   Verified address: [address] [place] [address]   Medication will be filled on 12/21/23.

## 2023-12-13 NOTE — Patient Instructions (Signed)

## 2023-12-13 NOTE — Progress Notes (Signed)
 Hematology and Oncology Follow Up Visit  Michael Knox [id number] 1945-11-15 78 y.o. 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  He still has a bad cough.  It is relatively nonproductive.  He said it started after he had the radiosurgery.  He also is having some pain over on that right side.  Will go ahead and get a chest x-ray on him to see how things are going.  I will go ahead and send in some Medrol Dosepak for him.  I really do not think he needs an antibiotic.  He has not had any fever.  He has not coughing up any purulent sputum.  I also will give him a Lidoderm patch to put on the ribs in that area.  He is on Cabometyx now.  He is going on this for a few weeks.  He is doing okay on the Cabometyx.  He is a little bit of a rash.  The rash is mostly on the upper back.  His wife is putting some lotion on this.  He has had no leg swelling.  Has been no change in bowel or bladder habits.  He has had no nausea or vomiting.  His wife said that he is eating pretty well.  Currently, I would say that his performance status about ECOG 1-2.    Medications:  Current Outpatient Medications:    amLODipine (NORVASC) 10 MG tablet, Take 10 mg by mouth daily., Disp: , Rfl:    aspirin 81 MG EC tablet, Take 1 tablet by mouth daily., Disp: , Rfl:    cabozantinib (CABOMETYX) 40 MG tablet, Take 1 tablet (40 mg total) by mouth daily. Take on an empty stomach, 1 hour before or 2 hours after meals., Disp: 30 tablet, Rfl: 4   Cholecalciferol 25 MCG (1000 UT) capsule, Take by mouth daily., Disp: , Rfl:     famotidine (PEPCID) 20 MG tablet, Take by mouth daily., Disp: , Rfl:    Garlic 1000 MG CAPS, Take by mouth daily., Disp: , Rfl:    hydrOXYzine (ATARAX) 25 MG tablet, Take 1 tablet (25 mg total) by mouth every 6 (six) hours as needed., Disp: 60 tablet, Rfl: 2   lidocaine-prilocaine (EMLA) cream, Apply 1 Application topically as needed. Place on port one hour prior to port access and cover with plastic wrap, Disp: 30 g, Rfl: 0   methylPREDNISolone (MEDROL DOSEPAK) 4 MG TBPK tablet, Take 6 x 1 day, then 5 one day, 4 the next day, 3, then 2, then 1, Disp: 21 tablet, Rfl: 2   Omega-3 1000 MG CAPS, Take 1 capsule by mouth daily., Disp: , Rfl:    promethazine (PHENERGAN) 25 MG tablet, Take 1 tablet (25 mg total) by mouth every 6 (six) hours as needed for nausea or vomiting., Disp: 30 tablet, Rfl: 0   rosuvastatin (CRESTOR) 20 MG tablet, TAKE 1 TABLET BY MOUTH EVERY DAY FOR CHOLESTEROL, Disp: , Rfl:    telmisartan (MICARDIS) 80 MG tablet, TAKE ONE TABLET BY MOUTH EACH DAY FOR BLOOD PRESSURE, Disp: , Rfl:  No current facility-administered medications for this visit.  Facility-Administered Medications Ordered in Other Visits:    sodium chloride flush (NS) 0.9 % injection 10 mL, 10 mL, Intravenous, PRN, [name], MD, 10 mL at 11/01/23 1610  Allergies:  Allergies  Allergen Reactions   Atorvastatin Other (See Comments)    Joint pain     Pitavastatin Other (See Comments)    Headache, constipation    Simvastatin Other (See Comments)    Joint pain      Past Medical History, Surgical history, Social history, and Family History were reviewed and updated.  Review of Systems: Review of Systems  Constitutional: Negative.   HENT:  Negative.    Eyes: Negative.   Respiratory: Negative.    Cardiovascular: Negative.   Gastrointestinal: Negative.   Endocrine: Negative.   Genitourinary: Negative.    Musculoskeletal: Negative.   Skin: Negative.   Neurological: Negative.   Hematological:  Negative.   Psychiatric/Behavioral: Negative.      Physical Exam:  height is 5\' 7"  (1.702 m) and weight is 179 lb (81.2 kg). His oral temperature is 97.9 F (36.6 C). His blood pressure is 146/62 (abnormal) and his pulse is 59 (abnormal). His respiration is 18 and oxygen saturation is 98%.   Wt Readings from Last 3 Encounters:  12/13/23 179 lb (81.2 kg)  12/12/23 180 lb 9.6 oz (81.9 kg)  11/21/23 179 lb (81.2 kg)    Physical Exam Vitals reviewed.  HENT:     Head: Normocephalic and atraumatic.  Eyes:     Pupils: Pupils are equal, round, and reactive to light.  Cardiovascular:     Rate and Rhythm: Normal rate and regular rhythm.     Heart sounds: Normal heart sounds.  Pulmonary:     Effort: Pulmonary effort is normal.     Breath sounds: Normal breath sounds.  Abdominal:     General: Bowel sounds are normal.     Palpations: Abdomen is soft.  Musculoskeletal:        General: No tenderness or deformity. Normal range of motion.     Cervical back: Normal range of motion.  Lymphadenopathy:     Cervical: No cervical adenopathy.  Skin:    General: Skin is warm and dry.     Findings: No erythema or rash.  Neurological:     Mental Status: He is alert and oriented to person, place, and time.  Psychiatric:        Behavior: Behavior normal.        Thought Content: Thought content normal.        Judgment: Judgment normal.     Lab Results  Component Value Date   WBC 5.1 12/13/2023   HGB 13.4 12/13/2023   HCT 39.9 12/13/2023   MCV 86.0 12/13/2023   PLT 135 (L) 12/13/2023     Chemistry      Component Value Date/Time   NA 141 12/13/2023 0820   K 4.5 12/13/2023 0820   CL 109 12/13/2023 0820   CO2 24 12/13/2023 0820   BUN 30 (H) 12/13/2023 0820   CREATININE 1.86 (H) 12/13/2023 0820      Component Value Date/Time   CALCIUM 8.8 (L) 12/13/2023 0820   ALKPHOS 115 12/13/2023 0820   AST 31 12/13/2023 0820   ALT 28 12/13/2023 0820   BILITOT 0.8 12/13/2023 0820      Impression and Plan: Michael Knox is a very nice 78 year old white male.  He has a history of recurrent clear-cell carcinoma of the kidney.  He had a early stage kidney  cancer of the right kidney back in 2013.  He had a partial nephrectomy.  He subsequently had a recurrence.  This was treated with radiation therapy.  Again, I suspect that he may have little bit of pneumonitis from the radiosurgery.  We will go ahead and see how the Medrol Dosepak does for him.  I will also send in some cough medicine.  I do not think that the Medicine is causing any problems for him.  He has only been on it for a couple of weeks or so.  It is still too early for us  to do any scans on him.  I probably would not do any scans on probably until June.  Hopefully, he will feel better with the cough medicine in the Medrol Dosepak.  We will plan to get him back to see us  in another month or so.  Ivor Mars, MD 4/15/20259:11 AM

## 2023-12-13 NOTE — Progress Notes (Signed)
 Specialty Pharmacy Ongoing Clinical Assessment Note  Spoke with patient's wife, Stephenie Einstein. Michael Knox is a 78 y.o. male who is being followed by the specialty pharmacy service for [facility] Oncology   Patient's specialty medication(s) reviewed today: Cabozantinib S-Malate (Cabometyx)   Missed doses in the last 4 weeks: 0   Patient/Caregiver asked additional questions regarding a letter from his grant, we discussed, it was just an informative letter.  Therapeutic benefit summary: Patient is achieving benefit   Adverse events/side effects summary: Experienced adverse events/side effects (one episode of diarrhea, Imodium resolved and no other issues)   Patient's therapy is appropriate to: Continue    Goals Addressed             This Visit's Progress    Slow Disease Progression   No change    Patient is initiating therapy. Patient will maintain adherence.         Follow up:  3 months  Malachi Screws Specialty Pharmacist

## 2023-12-21 ENCOUNTER — Other Ambulatory Visit: Payer: Self-pay

## 2024-01-03 ENCOUNTER — Encounter: Payer: Self-pay | Admitting: Hematology & Oncology

## 2024-01-09 ENCOUNTER — Other Ambulatory Visit: Payer: Self-pay

## 2024-01-09 IMAGING — MR MR ABDOMEN WO/W CM
16 series · 46 of 48 positions shown · IV contrast (multihance)
Comparison: Noncontrast CT from [facility] [REDACTED] on
08/18/2021

CLINICAL DATA: Follow-up metastatic renal cell carcinoma. Previous
partial right nephrectomy.

EXAM:
MRI ABDOMEN WITHOUT AND WITH CONTRAST
TECHNIQUE: Multiplanar multisequence MR imaging of the abdomen was performed
both before and after the administration of intravenous contrast.
CONTRAST:  18mL MULTIHANCE GADOBENATE DIMEGLUMINE 529 MG/ML IV SOLN

[Series 4: T2 · coronal · 5.0mm · 0.74mm/px · 1 of 19 slices shown (1 of 3)]
[im 1/19]
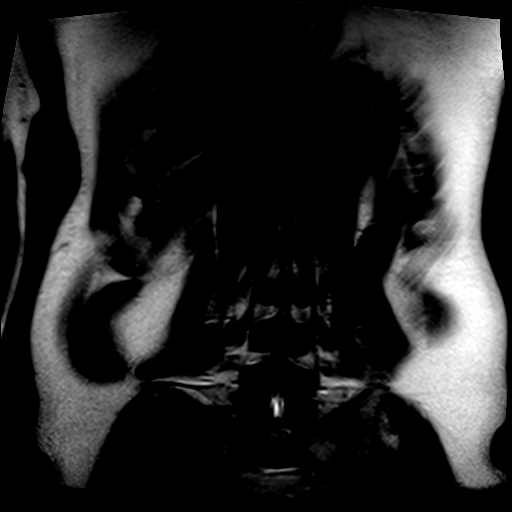

[Series 5: T2 · axial · 5.0mm · 0.68mm/px · 1 of 32 slices shown (2 of 3)]
[im 1/32]
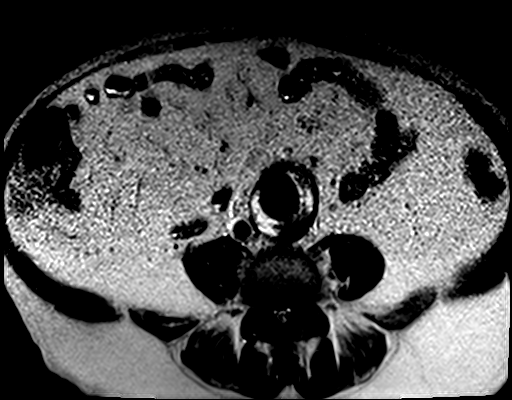

[Series 6: bSSFP · axial · 4.0mm · 0.68mm/px · 1 of 44 slices shown]
[im 1/44]
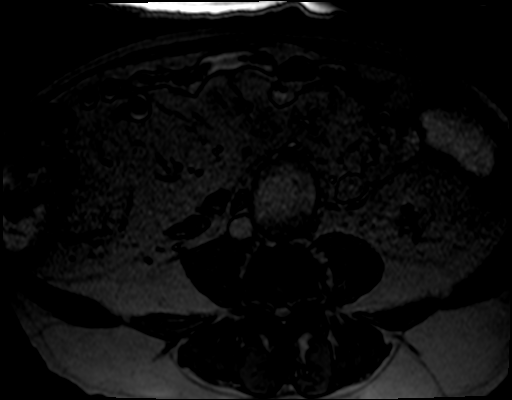

[Series 8: T2 · axial · 5.0mm · 1.37mm/px · 1 of 28 slices shown (3 of 3)]
[im 1/28]
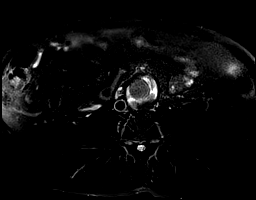

[Series 9: ep2d_diff_b50_500_800_p2_trig · axial · 6.0mm · 1.82mm/px · z∈[-208,+14]mm · 5 of 96 slices shown]
[im 1/96]
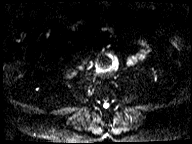
[im 24/96]
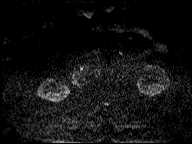
[im 48/96]
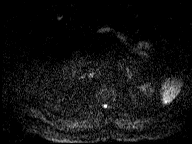
[im 72/96]
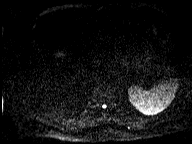
[im 96/96]
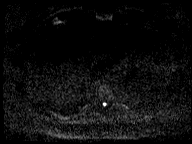

[Series 10: ep2d_diff_b50_500_800_p2_trig_adc · axial · 6.0mm · 1.82mm/px · z∈[-208,+14]mm · 2 of 32 slices shown]
[im 1/32]
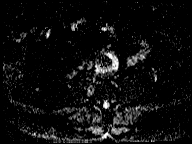
[im 32/32]
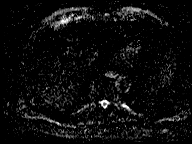

[Series 11: T1 · axial · 5.0mm · 0.68mm/px · z∈[-208,-54]mm · 3 of 54 slices shown]
[im 1/54]
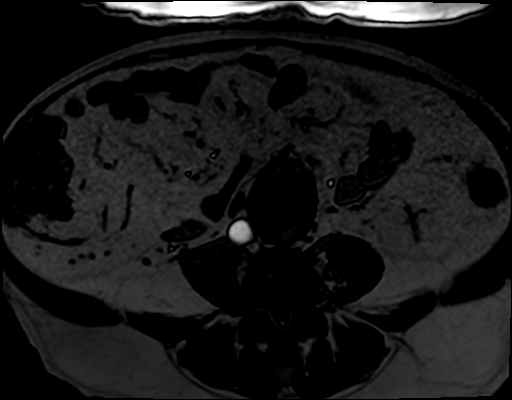
[im 27/54]
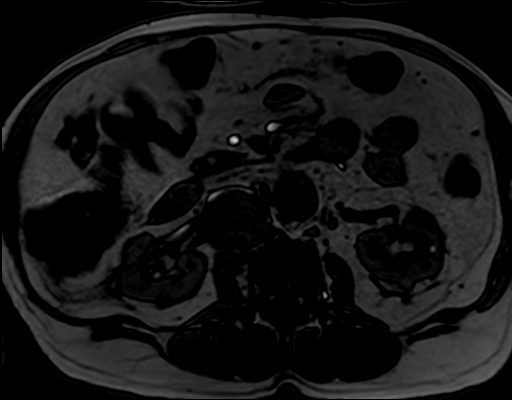
[im 54/54]
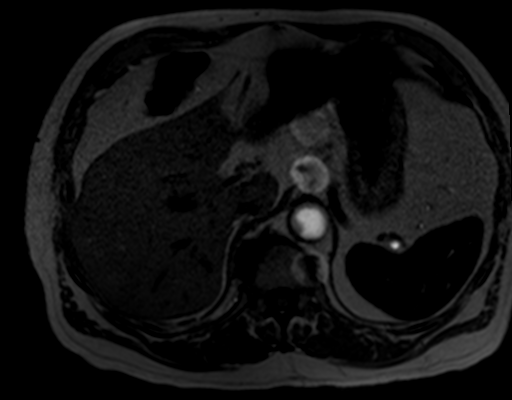

[Series 12: T1 dynamic · coronal · 2.5mm · 0.74mm/px · 3 of 52 slices shown (1 of 2)]
[im 1/52]
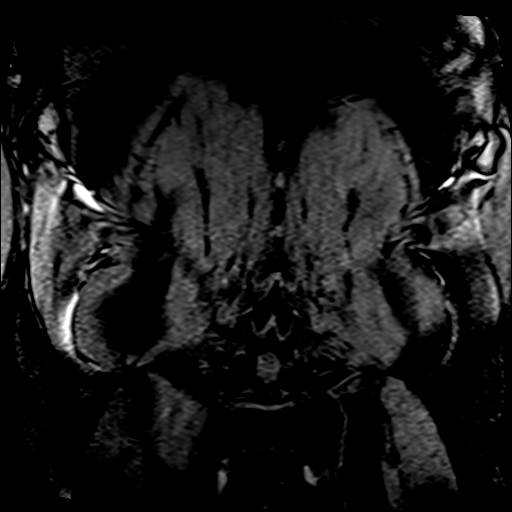
[im 26/52]
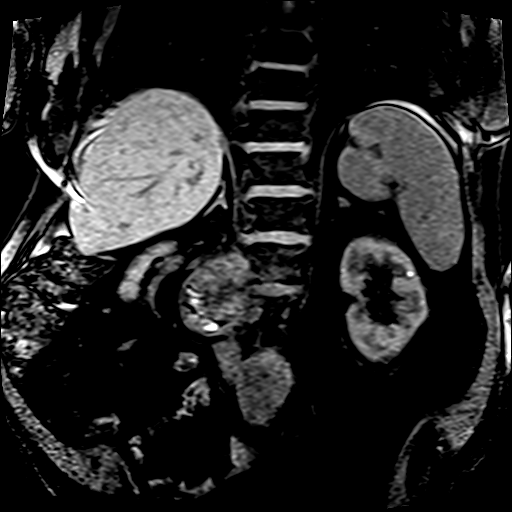
[im 52/52]
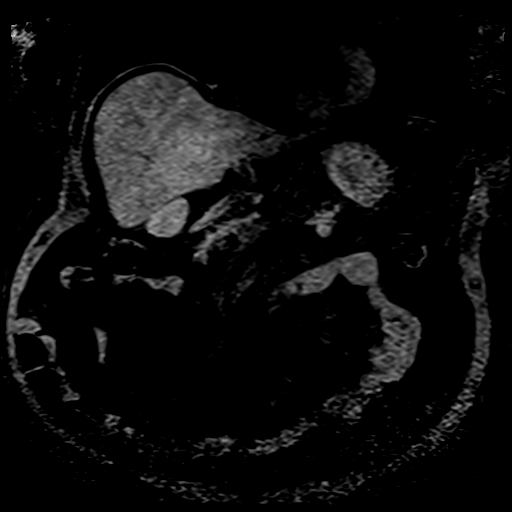

[Series 13: T1 dynamic · axial · non-contrast · 2.3mm · 1.37mm/px · z∈[-195,-34]mm · 4 of 72 slices shown (2 of 2)]
[im 1/72]
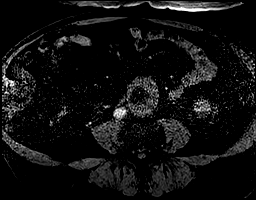
[im 24/72]
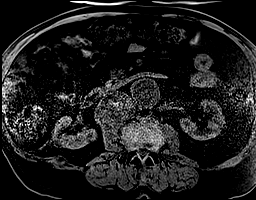
[im 48/72]
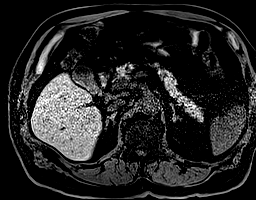
[im 72/72]
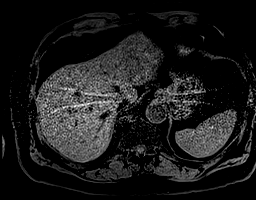

[Series 14: post 25 sec · axial · 2.3mm · 1.37mm/px · z∈[-195,-34]mm · 4 of 72 slices shown]
[im 1/72]
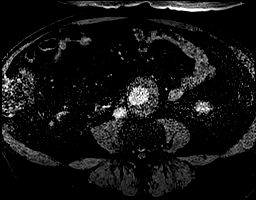
[im 24/72]
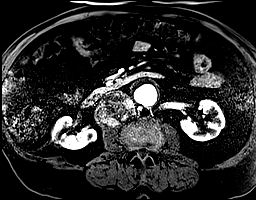
[im 48/72]
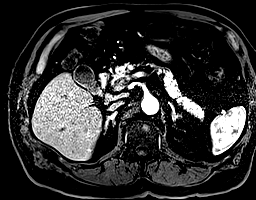
[im 72/72]
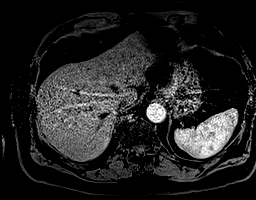

[Series 15: post 25 sec_sub · axial · 2.3mm · 1.37mm/px · z∈[-195,-34]mm · 4 of 72 slices shown]
[im 1/72]
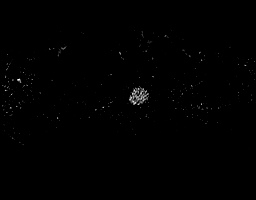
[im 24/72]
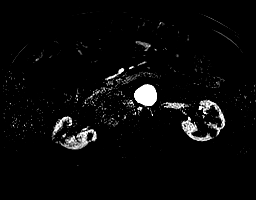
[im 48/72]
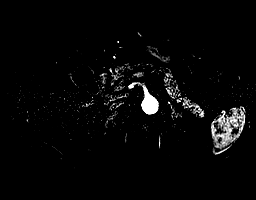
[im 72/72]
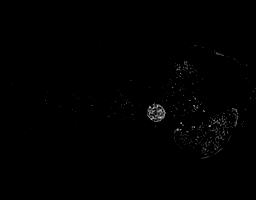

[Series 16: post 45 sec · axial · 2.3mm · 1.37mm/px · z∈[-195,-34]mm · 4 of 72 slices shown]
[im 1/72]
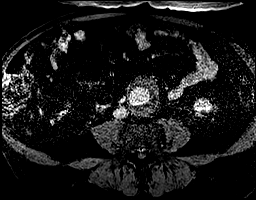
[im 24/72]
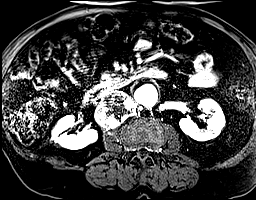
[im 48/72]
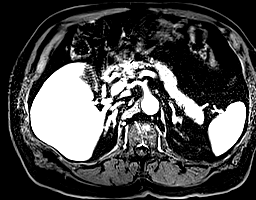
[im 72/72]
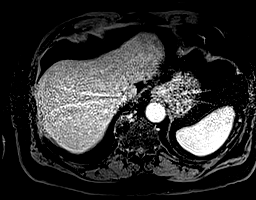

[Series 17: post 45 sec_sub · axial · 2.3mm · 1.37mm/px · z∈[-195,-34]mm · 4 of 72 slices shown]
[im 1/72]
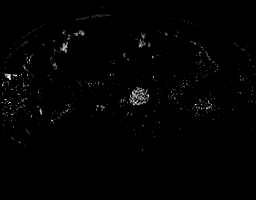
[im 24/72]
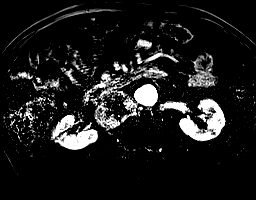
[im 48/72]
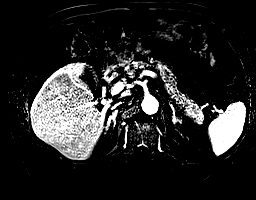
[im 72/72]
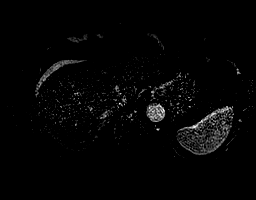

[Series 19: post 5 min · axial · 2.3mm · 1.37mm/px · z∈[-189,-28]mm · 4 of 72 slices shown]
[im 1/72]
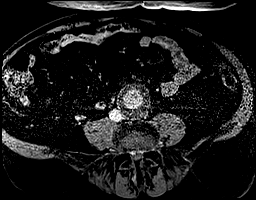
[im 24/72]
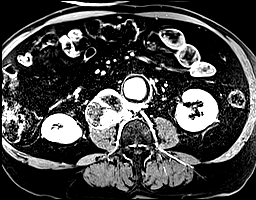
[im 48/72]
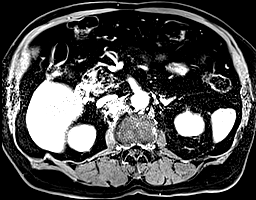
[im 72/72]
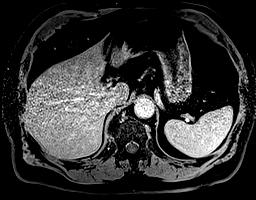

[Series 20: T1 dynamic post-contrast · coronal · 2.5mm · 0.74mm/px · 3 of 52 slices shown]
[im 1/52]
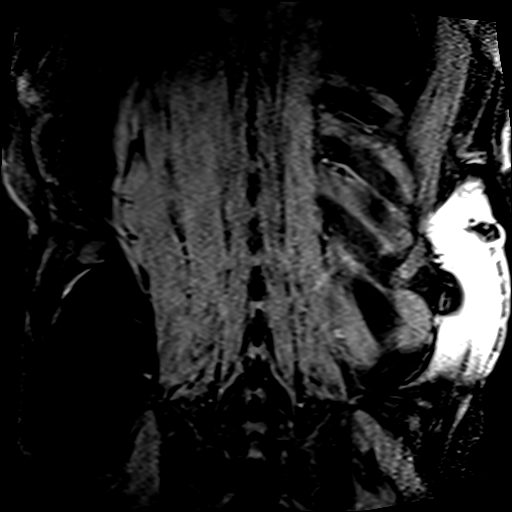
[im 26/52]
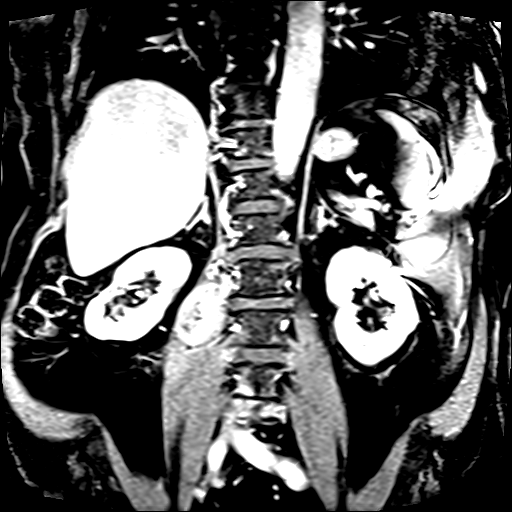
[im 52/52]
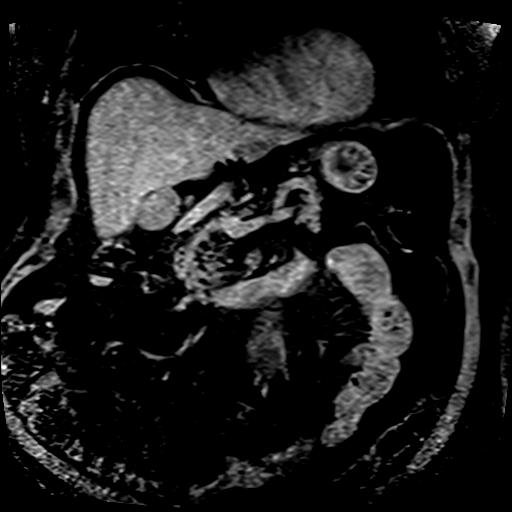

[Series 21: post axial 7 · axial · 2.3mm · 1.37mm/px · z∈[-189,-137]mm · 2 of 72 slices shown]
[im 1/72]
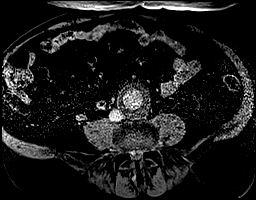
[im 24/72]
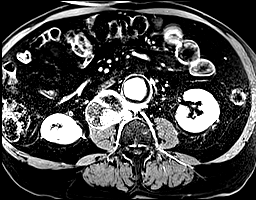

[46 of 48 positions shown; findings below may reference images not displayed]

FINDINGS: Lower chest: No acute findings.

Hepatobiliary: No hepatic masses identified. Gallbladder is
unremarkable. No evidence of biliary ductal dilatation.

Pancreas:  No mass or inflammatory changes.

Spleen:  Within normal limits in size and appearance.

Adrenals/Urinary Tract: Postop changes again seen from partial right
nephrectomy. Multiple tiny benign-appearing renal cysts are again
seen bilaterally (no followup imaging is recommended). No evidence
of renal mass or hydronephrosis.

Stomach/Bowel: Unremarkable.

Vascular/Lymphatic: A large heterogeneously enhancing
retroperitoneal mass is again seen in the retrocaval region. This
measures 7.3 by 5.7 x 4.4 cm, without significant change since prior
study. This is consistent with metastatic lymphadenopathy. No other
pathologically enlarged lymph nodes are identified. 4.7 cm
infrarenal abdominal aortic aneurysm shows no significant change.

Other:  None.

Musculoskeletal:  No suspicious bone lesions identified.
IMPRESSION: Stable right retroperitoneal mass, consistent with metastatic
lymphadenopathy. No new or progressive disease within the abdomen.

Stable 4.7 cm infrarenal abdominal aortic aneurysm.

Recommend follow-up every 6 months and vascular consultation.

Reference: [HOSPITAL] [id number];[DATE].

## 2024-01-11 ENCOUNTER — Telehealth: Payer: Self-pay | Admitting: Pharmacy Technician

## 2024-01-11 ENCOUNTER — Telehealth: Payer: Self-pay | Admitting: Pharmacist

## 2024-01-11 ENCOUNTER — Encounter: Payer: Self-pay | Admitting: Hematology & Oncology

## 2024-01-11 ENCOUNTER — Inpatient Hospital Stay

## 2024-01-11 ENCOUNTER — Inpatient Hospital Stay (HOSPITAL_BASED_OUTPATIENT_CLINIC_OR_DEPARTMENT_OTHER): Admitting: Hematology & Oncology

## 2024-01-11 ENCOUNTER — Other Ambulatory Visit: Payer: Self-pay | Admitting: Pharmacy Technician

## 2024-01-11 ENCOUNTER — Inpatient Hospital Stay: Attending: Hematology & Oncology

## 2024-01-11 ENCOUNTER — Other Ambulatory Visit (HOSPITAL_COMMUNITY): Payer: Self-pay

## 2024-01-11 VITALS — BP 150/70 | HR 73 | Temp 97.6°F | Resp 18 | Ht 67.0 in | Wt 178.1 lb

## 2024-01-11 DIAGNOSIS — C649 Malignant neoplasm of unspecified kidney, except renal pelvis: Secondary | ICD-10-CM

## 2024-01-11 DIAGNOSIS — C786 Secondary malignant neoplasm of retroperitoneum and peritoneum: Secondary | ICD-10-CM | POA: Insufficient documentation

## 2024-01-11 DIAGNOSIS — C641 Malignant neoplasm of right kidney, except renal pelvis: Secondary | ICD-10-CM | POA: Diagnosis present

## 2024-01-11 DIAGNOSIS — C7801 Secondary malignant neoplasm of right lung: Secondary | ICD-10-CM | POA: Diagnosis present

## 2024-01-11 LAB — CBC WITH DIFFERENTIAL (CANCER CENTER ONLY)
Abs Immature Granulocytes: 0.01 10*3/uL (ref 0.00–0.07)
Basophils Absolute: 0.1 10*3/uL (ref 0.0–0.1)
Basophils Relative: 2 %
Eosinophils Absolute: 1 10*3/uL — ABNORMAL HIGH (ref 0.0–0.5)
Eosinophils Relative: 28 %
HCT: 31.4 % — ABNORMAL LOW (ref 39.0–52.0)
Hemoglobin: 11 g/dL — ABNORMAL LOW (ref 13.0–17.0)
Immature Granulocytes: 0 %
Lymphocytes Relative: 18 %
Lymphs Abs: 0.6 10*3/uL — ABNORMAL LOW (ref 0.7–4.0)
MCH: 29.9 pg (ref 26.0–34.0)
MCHC: 35 g/dL (ref 30.0–36.0)
MCV: 85.3 fL (ref 80.0–100.0)
Monocytes Absolute: 0.3 10*3/uL (ref 0.1–1.0)
Monocytes Relative: 9 %
Neutro Abs: 1.5 10*3/uL — ABNORMAL LOW (ref 1.7–7.7)
Neutrophils Relative %: 43 %
Platelet Count: 111 10*3/uL — ABNORMAL LOW (ref 150–400)
RBC: 3.68 MIL/uL — ABNORMAL LOW (ref 4.22–5.81)
RDW: 17.4 % — ABNORMAL HIGH (ref 11.5–15.5)
WBC Count: 3.5 10*3/uL — ABNORMAL LOW (ref 4.0–10.5)
nRBC: 0 % (ref 0.0–0.2)

## 2024-01-11 LAB — CMP (CANCER CENTER ONLY)
ALT: 17 U/L (ref 0–44)
AST: 24 U/L (ref 15–41)
Albumin: 4 g/dL (ref 3.5–5.0)
Alkaline Phosphatase: 86 U/L (ref 38–126)
Anion gap: 8 (ref 5–15)
BUN: 23 mg/dL (ref 8–23)
CO2: 28 mmol/L (ref 22–32)
Calcium: 8.7 mg/dL — ABNORMAL LOW (ref 8.9–10.3)
Chloride: 105 mmol/L (ref 98–111)
Creatinine: 1.86 mg/dL — ABNORMAL HIGH (ref 0.61–1.24)
GFR, Estimated: 36 mL/min — ABNORMAL LOW (ref >60)
Glucose, Bld: 139 mg/dL — ABNORMAL HIGH (ref 70–99)
Potassium: 3.5 mmol/L (ref 3.5–5.1)
Sodium: 141 mmol/L (ref 135–145)
Total Bilirubin: 1.1 mg/dL (ref 0.0–1.2)
Total Protein: 6.2 g/dL — ABNORMAL LOW (ref 6.5–8.1)

## 2024-01-11 LAB — LACTATE DEHYDROGENASE: LDH: 383 U/L — ABNORMAL HIGH (ref 98–192)

## 2024-01-11 MED ORDER — SODIUM CHLORIDE 0.9% FLUSH
10.0000 mL | INTRAVENOUS | Status: DC | PRN
  Administered 2024-01-11: 10 mL via INTRAVENOUS

## 2024-01-11 MED ORDER — HEPARIN SOD (PORK) LOCK FLUSH 100 UNIT/ML IV SOLN: 500.0000 [IU] | Freq: Once | INTRAVENOUS | Status: DC

## 2024-01-11 MED ORDER — AXITINIB 5 MG PO TABS
5.0000 mg | ORAL_TABLET | Freq: Two times a day (BID) | ORAL | 6 refills | Status: DC
  Filled 2024-01-12: qty 60, 30d supply, fill #0

## 2024-01-11 NOTE — Progress Notes (Signed)
 Hematology and Oncology Follow Up Visit  Michael Knox [id number] [birth year] 78 y.o. 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  Unfortunately, I do not think he can tolerate the Cabometyx .  As such, we will switch him off to a different medication.  I will try him on Inlyta (5 mg p.o. twice daily).  Hopefully, he will respond to this and not have toxicity.  He is doing okay.  He does not have a good taste for food.  I told him to rinse his mouth out 6 times a day with water and baking soda.  Hopefully this will help with his taste.  He has had no pain.  I think he is still using the Lidoderm  patch for the rib pain that he has..  He has had no change in bowel or bladder habits.  He has had no leg swelling.  He did have a skin rash but that stopped once he stopped the Cabometyx .  He has had no headache.  He has had no swollen lymph nodes.  There is been no cough.  Overall, I will have to say that his performance status is probably ECOG 1.   Medications:  Current Outpatient Medications:    amLODipine (NORVASC) 10 MG tablet, Take 10 mg by mouth daily., Disp: , Rfl:    aspirin 81 MG EC tablet, Take 1 tablet by mouth daily., Disp: , Rfl:    Cholecalciferol 25 MCG (1000 UT) capsule, Take by mouth daily., Disp: , Rfl:    famotidine  (PEPCID ) 20 MG tablet, Take by mouth daily., Disp: , Rfl:    lidocaine -prilocaine  (EMLA ) cream, Apply 1 Application topically as needed. Place on port one hour prior to port access and cover with plastic wrap,  Disp: 30 g, Rfl: 0   rosuvastatin (CRESTOR) 20 MG tablet, TAKE 1 TABLET BY MOUTH EVERY DAY FOR CHOLESTEROL, Disp: , Rfl:    telmisartan (MICARDIS) 80 MG tablet, TAKE ONE TABLET BY MOUTH EACH DAY FOR BLOOD PRESSURE, Disp: , Rfl:    cabozantinib  (CABOMETYX ) 40 MG tablet, Take 1 tablet (40 mg total) by mouth daily. Take on an empty stomach, 1 hour before or 2 hours after meals. (Patient not taking: Reported on 01/11/2024), Disp: 30 tablet, Rfl: 4   HYDROcodone  bit-homatropine (HYCODAN) 5-1.5 MG/5ML syrup, Take 5 mLs by mouth every 6 (six) hours as needed for cough. (Patient not taking: Reported on 01/11/2024), Disp: 120 mL, Rfl: 0   promethazine  (PHENERGAN ) 25 MG tablet, Take 1 tablet (25 mg total) by mouth every 6 (six) hours as needed for nausea or vomiting. (Patient not taking: Reported on 01/11/2024), Disp: 30 tablet, Rfl: 0 No current facility-administered medications for this visit.  Facility-Administered Medications Ordered in Other Visits:    sodium chloride  flush (NS) 0.9 % injection 10 mL, 10 mL, Intravenous, PRN, Nicklaus Alviar R, MD, 10 mL at 11/01/23 4098  Allergies:  Allergies  Allergen Reactions   Atorvastatin Other (See Comments)    Joint pain     Pitavastatin Other (See Comments)  Headache, constipation    Simvastatin Other (See Comments)    Joint pain      Past Medical History, Surgical history, Social history, and Family History were reviewed and updated.  Review of Systems: Review of Systems  Constitutional: Negative.   HENT:  Negative.    Eyes: Negative.   Respiratory: Negative.    Cardiovascular: Negative.   Gastrointestinal: Negative.   Endocrine: Negative.   Genitourinary: Negative.    Musculoskeletal: Negative.   Skin: Negative.   Neurological: Negative.   Hematological: Negative.   Psychiatric/Behavioral: Negative.      Physical Exam:  height is 5\' 7"  (1.702 m) and weight is 178 lb 1.6 oz (80.8 kg). His oral temperature is 97.6 F (36.4 C). His  blood pressure is 150/70 (abnormal) and his pulse is 73. His respiration is 18 and oxygen saturation is 100%.   Wt Readings from Last 3 Encounters:  01/11/24 178 lb 1.6 oz (80.8 kg)  12/13/23 179 lb (81.2 kg)  12/12/23 180 lb 9.6 oz (81.9 kg)    Physical Exam Vitals reviewed.  HENT:     Head: Normocephalic and atraumatic.  Eyes:     Pupils: Pupils are equal, round, and reactive to light.  Cardiovascular:     Rate and Rhythm: Normal rate and regular rhythm.     Heart sounds: Normal heart sounds.  Pulmonary:     Effort: Pulmonary effort is normal.     Breath sounds: Normal breath sounds.  Abdominal:     General: Bowel sounds are normal.     Palpations: Abdomen is soft.  Musculoskeletal:        General: No tenderness or deformity. Normal range of motion.     Cervical back: Normal range of motion.  Lymphadenopathy:     Cervical: No cervical adenopathy.  Skin:    General: Skin is warm and dry.     Findings: No erythema or rash.  Neurological:     Mental Status: He is alert and oriented to person, place, and time.  Psychiatric:        Behavior: Behavior normal.        Thought Content: Thought content normal.        Judgment: Judgment normal.      Lab Results  Component Value Date   WBC 3.5 (L) 01/11/2024   HGB 11.0 (L) 01/11/2024   HCT 31.4 (L) 01/11/2024   MCV 85.3 01/11/2024   PLT 111 (L) 01/11/2024     Chemistry      Component Value Date/Time   NA 141 12/13/2023 0820   K 4.5 12/13/2023 0820   CL 109 12/13/2023 0820   CO2 24 12/13/2023 0820   BUN 30 (H) 12/13/2023 0820   CREATININE 1.86 (H) 12/13/2023 0820      Component Value Date/Time   CALCIUM 8.8 (L) 12/13/2023 0820   ALKPHOS 115 12/13/2023 0820   AST 31 12/13/2023 0820   ALT 28 12/13/2023 0820   BILITOT 0.8 12/13/2023 0820     Impression and Plan: Michael Knox is a very nice 78 year old white male.  Actually, his 78th birthday is coming up in 2 weeks.  Hopefully, this will be a special 1 for him.  I  am sure his family will have something planned.  I hate that he cannot tolerate the Cabometyx .  Given that fact, we will change him over to [facility] and see how he does with the Inlyta.  I still think it is still too early to do scans on  him.  I think we really need to keep him on the Inlyta for a good 6 weeks and then recheck his scans.  I am glad that the itching is better.  I will plan to get him back to see me in about a month or so.  If there is any problems I am sure that his wife will let us  know.  He is fortunate to have such a good family and a good support system.  Again, I am sure that he will have a wonderful 78th birthday.     Ivor Mars, MD 5/14/20251:46 PM

## 2024-01-11 NOTE — Progress Notes (Signed)
 BP remains elevated, will continue to monitor at home and notify PCP of BP readings. Wife verbalized understanding.

## 2024-01-11 NOTE — Patient Instructions (Signed)

## 2024-01-11 NOTE — Telephone Encounter (Signed)
 Oral Oncology Patient Advocate Encounter  Prior Authorization for [employer] has been approved.    PA# [id number] Effective dates: 01/11/2024 through 08/29/2024  Patients co-pay is $0.    Thresa Floor, CPhT Oncology Pharmacy Patient Advocate [facility] Direct Number: [phone] Fax: [phone]

## 2024-01-11 NOTE — Telephone Encounter (Signed)
 Oral Oncology Patient Advocate Encounter   Received notification that prior authorization for Inlyta is required.   PA submitted on 01/11/2024 Key [id number] Status is pending     Patty Benjaman Branch, CPhT Oncology Pharmacy Patient Advocate [facility] Direct Number: [phone] Fax: [phone]

## 2024-01-11 NOTE — Telephone Encounter (Signed)
 Oral Oncology Pharmacist Encounter  Received new prescription for Inlyta (axitinib) for the treatment of metastatic clear-cell RCC, planned duration until disease progression or unacceptable drug toxicity.  CBC w/ Diff and CMP from 01/11/24 assessed, patient WBC 3.5 K/uL (ANC 1500), pltc 111 K/uL - no baseline dose adjustments required at this time. Patient with Scr of 1.86 mg/dL (CrCl ~09.8 mL/min) - no dose adjustments required. Noted patient with BP of 150/70 mmHg - recommend close monitoring of BP as Inlyta can exacerbate HTN. Prescription dose and frequency assessed for appropriateness.  Current medication list in Epic reviewed, no relevant/significant DDIs with Inlyta identified.  Evaluated chart and no patient barriers to medication adherence noted.   Prescription has been e-scribed to the [facility] for benefits analysis and approval.  Oral Oncology Clinic will continue to follow for insurance authorization, copayment issues, initial counseling and start date.  Jude Norton, PharmD, BCPS, BCOP Hematology/Oncology Clinical Pharmacist [facility] and [facility] Oral Chemotherapy Navigation Clinics [phone] 01/11/2024 3:31 PM

## 2024-01-11 NOTE — Progress Notes (Signed)
 Oral Oncology Patient Advocate Encounter  Patient will be switching to another medication - disenrolling for now.  Michael Knox, CPhT Oncology Pharmacy Patient Advocate [facility] Direct Number: [phone] Fax: [phone]

## 2024-01-12 ENCOUNTER — Telehealth: Payer: Self-pay | Admitting: Pharmacy Technician

## 2024-01-12 ENCOUNTER — Other Ambulatory Visit (HOSPITAL_COMMUNITY): Payer: Self-pay

## 2024-01-12 ENCOUNTER — Other Ambulatory Visit: Payer: Self-pay | Admitting: Pharmacy Technician

## 2024-01-12 NOTE — Progress Notes (Signed)
 Specialty Pharmacy Initial Fill Coordination Note  Michael Knox is a 78 y.o. male contacted today regarding refills of specialty medication(s) Axitinib (INLYTA) .  Patient requested Delivery  on 01/16/24  to verified address [address] [place] [address]   Medication will be filled on 05/16.   Patient is aware of $0 copayment.   Patty Benjaman Branch, CPhT Oncology Pharmacy Patient Advocate [facility] Direct Number: [phone] Fax: [phone]

## 2024-01-12 NOTE — Telephone Encounter (Signed)
 Oral Chemotherapy Pharmacist Encounter  I spoke with patient and patient's wife for overview of: Inlyta (axitinib) for the treatment of previously treated, advanced or metastatic renal cell carcinoma, planned duration until disease progression or unacceptable toxicity.   Counseled patient on administration, dosing, side effects, monitoring, drug-food interactions, safe handling, storage, and disposal.  Patient will take Inlyta 5mg  tablets, 1 tablet by mouth twice daily, without regard to food, with a glass of water.  Patient instructed to avoid grapefruit and grapefruit juice while on therapy with Inlyta.  Inlyta start date: 01/17/24  Adverse effects include but are not limited to: hypertension, hand-foot syndrome, nausea, vomiting, diarrhea, fatigue, and abnormal laboratory values.   Diarrhea: Patient will obtain anti diarrheal and alert the office of 4 or more loose stools above baseline. HTN: patient and patient's wife know to notify PCP regarding his BP for further management, especially since Inlyta can also cause HTN.  Inlyta will be held at least 24 hours prior to surgery and resumed at discretion of treating physician based on wound healing  Reviewed with patient importance of keeping a medication schedule and plan for any missed doses. No barriers to medication adherence identified.  Medication reconciliation performed and medication/allergy list updated.  All questions answered.  Patient and patient's wife voiced understanding and appreciation.   Medication education handout placed in mail for patient. Patient knows to call the office with questions or concerns. Oral Chemotherapy Clinic phone number provided to patient.   Jude Norton, PharmD, BCPS, BCOP Hematology/Oncology Clinical Pharmacist [facility] and [facility] Oral Chemotherapy Navigation Clinics [phone] 01/12/2024 10:44 AM

## 2024-01-12 NOTE — Telephone Encounter (Signed)
 Patient successfully OnBoarded and drug education provided by pharmacist. Medication scheduled to be shipped on 05/16 for delivery on 05/19 from [facility] to patient's address. Patient also knows to call me at [phone] with any questions or concerns regarding receiving medication or if there is any unexpected change in co-pay.   Patty Benjaman Branch, CPhT Oncology Pharmacy Patient Advocate [facility] Direct Number: [phone] Fax: [phone]

## 2024-01-12 NOTE — Progress Notes (Signed)
 Oral Chemotherapy Pharmacist Encounter  Patient and patient's wife were counseled under telephone encounter from 01/11/24.  Jude Norton, PharmD, BCPS, BCOP Hematology/Oncology Clinical Pharmacist [facility] and [facility] Oral Chemotherapy Navigation Clinics [phone] 01/12/2024 11:31 AM

## 2024-01-23 ENCOUNTER — Encounter: Payer: Self-pay | Admitting: Hematology & Oncology

## 2024-01-24 ENCOUNTER — Other Ambulatory Visit: Payer: Self-pay

## 2024-02-07 ENCOUNTER — Other Ambulatory Visit: Payer: Self-pay

## 2024-02-13 ENCOUNTER — Inpatient Hospital Stay

## 2024-02-13 ENCOUNTER — Inpatient Hospital Stay (HOSPITAL_BASED_OUTPATIENT_CLINIC_OR_DEPARTMENT_OTHER): Admitting: Hematology & Oncology

## 2024-02-13 ENCOUNTER — Other Ambulatory Visit: Payer: Self-pay

## 2024-02-13 ENCOUNTER — Encounter: Payer: Self-pay | Admitting: Hematology & Oncology

## 2024-02-13 ENCOUNTER — Ambulatory Visit: Payer: Self-pay | Admitting: Hematology & Oncology

## 2024-02-13 ENCOUNTER — Inpatient Hospital Stay: Attending: Hematology & Oncology

## 2024-02-13 VITALS — BP 122/56 | HR 63 | Temp 97.9°F | Resp 16 | Ht 67.0 in | Wt 169.0 lb

## 2024-02-13 DIAGNOSIS — C786 Secondary malignant neoplasm of retroperitoneum and peritoneum: Secondary | ICD-10-CM | POA: Insufficient documentation

## 2024-02-13 DIAGNOSIS — R634 Abnormal weight loss: Secondary | ICD-10-CM | POA: Insufficient documentation

## 2024-02-13 DIAGNOSIS — R21 Rash and other nonspecific skin eruption: Secondary | ICD-10-CM | POA: Diagnosis not present

## 2024-02-13 DIAGNOSIS — R630 Anorexia: Secondary | ICD-10-CM | POA: Diagnosis not present

## 2024-02-13 DIAGNOSIS — C641 Malignant neoplasm of right kidney, except renal pelvis: Secondary | ICD-10-CM | POA: Insufficient documentation

## 2024-02-13 DIAGNOSIS — C7801 Secondary malignant neoplasm of right lung: Secondary | ICD-10-CM | POA: Insufficient documentation

## 2024-02-13 DIAGNOSIS — C649 Malignant neoplasm of unspecified kidney, except renal pelvis: Secondary | ICD-10-CM

## 2024-02-13 LAB — CMP (CANCER CENTER ONLY)
ALT: 12 U/L (ref 0–44)
AST: 15 U/L (ref 15–41)
Albumin: 4.2 g/dL (ref 3.5–5.0)
Alkaline Phosphatase: 78 U/L (ref 38–126)
Anion gap: 9 (ref 5–15)
BUN: 26 mg/dL — ABNORMAL HIGH (ref 8–23)
CO2: 25 mmol/L (ref 22–32)
Calcium: 9.2 mg/dL (ref 8.9–10.3)
Chloride: 108 mmol/L (ref 98–111)
Creatinine: 1.8 mg/dL — ABNORMAL HIGH (ref 0.61–1.24)
GFR, Estimated: 38 mL/min — ABNORMAL LOW (ref >60)
Glucose, Bld: 103 mg/dL — ABNORMAL HIGH (ref 70–99)
Potassium: 3.8 mmol/L (ref 3.5–5.1)
Sodium: 142 mmol/L (ref 135–145)
Total Bilirubin: 0.7 mg/dL (ref 0.0–1.2)
Total Protein: 6.9 g/dL (ref 6.5–8.1)

## 2024-02-13 LAB — CBC WITH DIFFERENTIAL (CANCER CENTER ONLY)
Abs Immature Granulocytes: 0.02 10*3/uL (ref 0.00–0.07)
Basophils Absolute: 0.1 10*3/uL (ref 0.0–0.1)
Basophils Relative: 1 %
Eosinophils Absolute: 0.9 10*3/uL — ABNORMAL HIGH (ref 0.0–0.5)
Eosinophils Relative: 21 %
HCT: 33.5 % — ABNORMAL LOW (ref 39.0–52.0)
Hemoglobin: 11 g/dL — ABNORMAL LOW (ref 13.0–17.0)
Immature Granulocytes: 0 %
Lymphocytes Relative: 15 %
Lymphs Abs: 0.7 10*3/uL (ref 0.7–4.0)
MCH: 30.7 pg (ref 26.0–34.0)
MCHC: 32.8 g/dL (ref 30.0–36.0)
MCV: 93.6 fL (ref 80.0–100.0)
Monocytes Absolute: 0.5 10*3/uL (ref 0.1–1.0)
Monocytes Relative: 11 %
Neutro Abs: 2.4 10*3/uL (ref 1.7–7.7)
Neutrophils Relative %: 52 %
Platelet Count: 176 10*3/uL (ref 150–400)
RBC: 3.58 MIL/uL — ABNORMAL LOW (ref 4.22–5.81)
RDW: 16.9 % — ABNORMAL HIGH (ref 11.5–15.5)
WBC Count: 4.6 10*3/uL (ref 4.0–10.5)
nRBC: 0 % (ref 0.0–0.2)

## 2024-02-13 LAB — LACTATE DEHYDROGENASE: LDH: 185 U/L (ref 98–192)

## 2024-02-13 LAB — TSH: TSH: 1.92 u[IU]/mL (ref 0.350–4.500)

## 2024-02-13 MED ORDER — SODIUM CHLORIDE 0.9% FLUSH
10.0000 mL | INTRAVENOUS | Status: DC | PRN
  Administered 2024-02-13 (×2): 10 mL via INTRAVENOUS

## 2024-02-13 MED ORDER — MOMETASONE FUROATE 0.1 % EX SOLN: Freq: Every day | CUTANEOUS | 2 refills | Status: AC

## 2024-02-13 MED ORDER — HEPARIN SOD (PORK) LOCK FLUSH 100 UNIT/ML IV SOLN: 500.0000 [IU] | Freq: Once | INTRAVENOUS | Status: AC

## 2024-02-13 NOTE — Progress Notes (Signed)
 Per office visit on 02/13/24, patient's Inlyta  was discontinued by Dr. Maria Shiner due to side effects (weight loss, poor appetite, not feeling well). Confirmed with Ivette Marks that patient had stopped medication in May. Disenrolled.

## 2024-02-13 NOTE — Telephone Encounter (Signed)
 Advised via MyChart.

## 2024-02-13 NOTE — Progress Notes (Signed)
 Hematology and Oncology Follow Up Visit  QUINTON VOTH [id number] Sep 23, 1945 78 y.o. 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 Deady is in for follow-up.  He cannot tolerate the Inlyta .  He just was not feeling well.  He lost weight.  He had a poor appetite.  He was tired.  He started to feel better now.  I did state that he just cannot tolerate oral therapy for his metastatic renal cell carcinoma.  Again he does feel better right now.  He did have a nice birthday since we last saw him.  His appetite is improving.  He has had no change in bowel or bladder habits.  He  still has itching.  Still has a rash on his left shoulder.  I will send in some Elocon lotion to put on once a day.  He has had no leg swelling.  There has been no bleeding.  He has had no headache.  He has had no mouth sores.  He has had no cough.  Overall, his performance status is probably ECOG 1.    Medications:  Current Outpatient Medications:    amLODipine (NORVASC) 10 MG tablet, Take 10 mg by mouth daily., Disp: , Rfl:    aspirin 81 MG EC tablet, Take 1 tablet by mouth daily., Disp: , Rfl:    axitinib  (INLYTA ) 5 MG tablet, Take 1 tablet (5 mg total) by mouth 2 (two) times daily., Disp: 60 tablet, Rfl: 6   Cholecalciferol 25 MCG (1000 UT) capsule, Take by mouth daily., Disp: , Rfl:    famotidine  (PEPCID ) 20 MG tablet, Take by mouth daily., Disp: , Rfl:    HYDROcodone  bit-homatropine (HYCODAN) 5-1.5 MG/5ML syrup, Take 5 mLs by mouth every 6 (six) hours  as needed for cough. (Patient not taking: Reported on 01/11/2024), Disp: 120 mL, Rfl: 0   lidocaine -prilocaine  (EMLA ) cream, Apply 1 Application topically as needed. Place on port one hour prior to port access and cover with plastic wrap, Disp: 30 g, Rfl: 0   promethazine  (PHENERGAN ) 25 MG tablet, Take 1 tablet (25 mg total) by mouth every 6 (six) hours as needed for nausea or vomiting. (Patient not taking: Reported on 01/11/2024), Disp: 30 tablet, Rfl: 0   rosuvastatin (CRESTOR) 20 MG tablet, TAKE 1 TABLET BY MOUTH EVERY DAY FOR CHOLESTEROL, Disp: , Rfl:    telmisartan (MICARDIS) 80 MG tablet, TAKE ONE TABLET BY MOUTH EACH DAY FOR BLOOD PRESSURE, Disp: , Rfl:  No current facility-administered medications for this visit.  Facility-Administered Medications Ordered in Other Visits:    heparin  lock flush 100 unit/mL, 500 Units, Intravenous, Once, Yamileth Hayse, Sherryll Donald, MD   sodium chloride  flush (NS) 0.9 % injection 10 mL, 10 mL, Intravenous, PRN, Toriann Spadoni R, MD, 10 mL at 11/01/23 0904   sodium chloride  flush (NS) 0.9 % injection 10 mL, 10 mL, Intravenous, PRN, Adriana Lina R, MD, 10 mL at 02/13/24 0754  Allergies:  Allergies  Allergen Reactions   Atorvastatin Other (See Comments)    Joint pain  Cabozantinib  Diarrhea, Nausea And Vomiting and Rash   Pitavastatin Other (See Comments)    Headache, constipation    Simvastatin Other (See Comments)    Joint pain      Past Medical History, Surgical history, Social history, and Family History were reviewed and updated.  Review of Systems: Review of Systems  Constitutional: Negative.   HENT:  Negative.    Eyes: Negative.   Respiratory: Negative.    Cardiovascular: Negative.   Gastrointestinal: Negative.   Endocrine: Negative.   Genitourinary: Negative.    Musculoskeletal: Negative.   Skin: Negative.   Neurological: Negative.   Hematological: Negative.   Psychiatric/Behavioral: Negative.      Physical Exam:  height is 5' 7  (1.702 m) and weight is 169 lb (76.7 kg). His oral temperature is 97.9 F (36.6 C). His blood pressure is 122/56 (abnormal) and his pulse is 63. His respiration is 16 and oxygen saturation is 99%.   Wt Readings from Last 3 Encounters:  02/13/24 169 lb (76.7 kg)  01/11/24 178 lb 1.6 oz (80.8 kg)  12/13/23 179 lb (81.2 kg)    Physical Exam Vitals reviewed.  HENT:     Head: Normocephalic and atraumatic.   Eyes:     Pupils: Pupils are equal, round, and reactive to light.    Cardiovascular:     Rate and Rhythm: Normal rate and regular rhythm.     Heart sounds: Normal heart sounds.  Pulmonary:     Effort: Pulmonary effort is normal.     Breath sounds: Normal breath sounds.  Abdominal:     General: Bowel sounds are normal.     Palpations: Abdomen is soft.   Musculoskeletal:        General: No tenderness or deformity. Normal range of motion.     Cervical back: Normal range of motion.  Lymphadenopathy:     Cervical: No cervical adenopathy.   Skin:    General: Skin is warm and dry.     Findings: No erythema or rash.   Neurological:     Mental Status: He is alert and oriented to person, place, and time.   Psychiatric:        Behavior: Behavior normal.        Thought Content: Thought content normal.        Judgment: Judgment normal.      Lab Results  Component Value Date   WBC 4.6 02/13/2024   HGB 11.0 (L) 02/13/2024   HCT 33.5 (L) 02/13/2024   MCV 93.6 02/13/2024   PLT 176 02/13/2024     Chemistry      Component Value Date/Time   NA 141 01/11/2024 1310   K 3.5 01/11/2024 1310   CL 105 01/11/2024 1310   CO2 28 01/11/2024 1310   BUN 23 01/11/2024 1310   CREATININE 1.86 (H) 01/11/2024 1310      Component Value Date/Time   CALCIUM 8.7 (L) 01/11/2024 1310   ALKPHOS 86 01/11/2024 1310   AST 24 01/11/2024 1310   ALT 17 01/11/2024 1310   BILITOT 1.1 01/11/2024 1310     Impression and Plan: Mr. Jerriah Ines is a very nice 78 year old white male.  Again, we are just  having trouble finding medicine that he can take for his kidney cancer.  I will hold off on treatment right now.  Again this is about quality of life.  I will do a CT scan on him.  Will get that set up with a week after July 4.  Hopefully, the Elocon cream will help with this rash on the left shoulder.  I just want him to function better.  Hopefully, he will be able to function better.  Hopefully, we will be able to keep him off therapy for a while.  I will plan to see him back myself in about a month or so.    Ivor Mars, MD 6/16/20258:12 AM

## 2024-02-13 NOTE — Telephone Encounter (Signed)
-----   Message from Ivor Mars sent at 02/13/2024 12:27 PM EDT ----- Please call his wife and let her know that the thyroid  is okay. ----- Message ----- From: Dannis Dy, [facility] Sent: 02/13/2024   8:09 AM EDT To: Ivor Mars, MD

## 2024-02-14 ENCOUNTER — Other Ambulatory Visit: Payer: Self-pay

## 2024-03-12 ENCOUNTER — Ambulatory Visit (HOSPITAL_BASED_OUTPATIENT_CLINIC_OR_DEPARTMENT_OTHER)
Admission: RE | Admit: 2024-03-12 | Discharge: 2024-03-12 | Disposition: A | Discharge status: Home / Self Care | Source: Ambulatory Visit | Attending: Hematology & Oncology | Admitting: Hematology & Oncology

## 2024-03-12 DIAGNOSIS — R935 Abnormal findings on diagnostic imaging of other abdominal regions, including retroperitoneum: Secondary | ICD-10-CM | POA: Diagnosis not present

## 2024-03-12 DIAGNOSIS — R918 Other nonspecific abnormal finding of lung field: Secondary | ICD-10-CM | POA: Insufficient documentation

## 2024-03-12 DIAGNOSIS — I700 Atherosclerosis of aorta: Secondary | ICD-10-CM | POA: Insufficient documentation

## 2024-03-12 DIAGNOSIS — Z923 Personal history of irradiation: Secondary | ICD-10-CM | POA: Insufficient documentation

## 2024-03-12 DIAGNOSIS — C649 Malignant neoplasm of unspecified kidney, except renal pelvis: Secondary | ICD-10-CM | POA: Insufficient documentation

## 2024-03-12 DIAGNOSIS — C799 Secondary malignant neoplasm of unspecified site: Secondary | ICD-10-CM | POA: Diagnosis present

## 2024-03-12 DIAGNOSIS — J439 Emphysema, unspecified: Secondary | ICD-10-CM | POA: Insufficient documentation

## 2024-03-12 DIAGNOSIS — I2510 Atherosclerotic heart disease of native coronary artery without angina pectoris: Secondary | ICD-10-CM | POA: Diagnosis not present

## 2024-03-12 MED ORDER — IOHEXOL 300 MG/ML  SOLN
80.0000 mL | Freq: Once | INTRAMUSCULAR | Status: AC | PRN
  Administered 2024-03-12: 80 mL via INTRAVENOUS

## 2024-03-14 ENCOUNTER — Ambulatory Visit: Payer: Self-pay | Admitting: Hematology & Oncology

## 2024-03-16 ENCOUNTER — Inpatient Hospital Stay: Attending: Hematology & Oncology

## 2024-03-16 ENCOUNTER — Encounter: Payer: Self-pay | Admitting: Hematology & Oncology

## 2024-03-16 ENCOUNTER — Inpatient Hospital Stay (HOSPITAL_BASED_OUTPATIENT_CLINIC_OR_DEPARTMENT_OTHER): Admitting: Hematology & Oncology

## 2024-03-16 ENCOUNTER — Inpatient Hospital Stay

## 2024-03-16 VITALS — BP 138/62 | HR 61 | Temp 98.2°F | Resp 19 | Ht 67.0 in | Wt 171.0 lb

## 2024-03-16 DIAGNOSIS — C641 Malignant neoplasm of right kidney, except renal pelvis: Secondary | ICD-10-CM | POA: Insufficient documentation

## 2024-03-16 DIAGNOSIS — Z79899 Other long term (current) drug therapy: Secondary | ICD-10-CM | POA: Insufficient documentation

## 2024-03-16 DIAGNOSIS — C786 Secondary malignant neoplasm of retroperitoneum and peritoneum: Secondary | ICD-10-CM | POA: Diagnosis present

## 2024-03-16 DIAGNOSIS — C649 Malignant neoplasm of unspecified kidney, except renal pelvis: Secondary | ICD-10-CM

## 2024-03-16 DIAGNOSIS — C7801 Secondary malignant neoplasm of right lung: Secondary | ICD-10-CM | POA: Diagnosis present

## 2024-03-16 LAB — CMP (CANCER CENTER ONLY)
ALT: 9 U/L (ref 0–44)
AST: 13 U/L — ABNORMAL LOW (ref 15–41)
Albumin: 4.2 g/dL (ref 3.5–5.0)
Alkaline Phosphatase: 71 U/L (ref 38–126)
Anion gap: 7 (ref 5–15)
BUN: 28 mg/dL — ABNORMAL HIGH (ref 8–23)
CO2: 25 mmol/L (ref 22–32)
Calcium: 9.2 mg/dL (ref 8.9–10.3)
Chloride: 109 mmol/L (ref 98–111)
Creatinine: 1.7 mg/dL — ABNORMAL HIGH (ref 0.61–1.24)
GFR, Estimated: 40 mL/min — ABNORMAL LOW (ref >60)
Glucose, Bld: 101 mg/dL — ABNORMAL HIGH (ref 70–99)
Potassium: 4.1 mmol/L (ref 3.5–5.1)
Sodium: 141 mmol/L (ref 135–145)
Total Bilirubin: 0.5 mg/dL (ref 0.0–1.2)
Total Protein: 6.8 g/dL (ref 6.5–8.1)

## 2024-03-16 LAB — LACTATE DEHYDROGENASE: LDH: 154 U/L (ref 98–192)

## 2024-03-16 LAB — CBC WITH DIFFERENTIAL (CANCER CENTER ONLY)
Abs Immature Granulocytes: 0.02 K/uL (ref 0.00–0.07)
Basophils Absolute: 0.1 K/uL (ref 0.0–0.1)
Basophils Relative: 2 %
Eosinophils Absolute: 0.5 K/uL (ref 0.0–0.5)
Eosinophils Relative: 11 %
HCT: 35.4 % — ABNORMAL LOW (ref 39.0–52.0)
Hemoglobin: 11.7 g/dL — ABNORMAL LOW (ref 13.0–17.0)
Immature Granulocytes: 0 %
Lymphocytes Relative: 17 %
Lymphs Abs: 0.8 K/uL (ref 0.7–4.0)
MCH: 31 pg (ref 26.0–34.0)
MCHC: 33.1 g/dL (ref 30.0–36.0)
MCV: 93.7 fL (ref 80.0–100.0)
Monocytes Absolute: 0.5 K/uL (ref 0.1–1.0)
Monocytes Relative: 10 %
Neutro Abs: 2.9 K/uL (ref 1.7–7.7)
Neutrophils Relative %: 60 %
Platelet Count: 156 K/uL (ref 150–400)
RBC: 3.78 MIL/uL — ABNORMAL LOW (ref 4.22–5.81)
RDW: 13.8 % (ref 11.5–15.5)
WBC Count: 4.8 K/uL (ref 4.0–10.5)
nRBC: 0 % (ref 0.0–0.2)

## 2024-03-16 MED ORDER — CLOTRIMAZOLE-BETAMETHASONE 1-0.05 % EX CREA: 1.0000 | TOPICAL_CREAM | Freq: Two times a day (BID) | CUTANEOUS | 4 refills | Status: AC

## 2024-03-16 MED ORDER — SODIUM CHLORIDE 0.9% FLUSH
10.0000 mL | INTRAVENOUS | Status: DC | PRN
  Administered 2024-03-16: 10 mL via INTRAVENOUS

## 2024-03-16 MED ORDER — HEPARIN SOD (PORK) LOCK FLUSH 100 UNIT/ML IV SOLN
500.0000 [IU] | Freq: Once | INTRAVENOUS | Status: AC
  Administered 2024-03-16: 500 [IU] via INTRAVENOUS

## 2024-03-16 NOTE — Progress Notes (Signed)
 Hematology and Oncology Follow Up Visit  Michael Knox [id number] 09/16/1945 78 y.o. 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  He is feeling a lot better.  I am sure a lot of this has to do with him being off treatment now.  He really just had a hard time with oral therapy.  We did do a CT scan on him.  This was done on 03/12/2024.  Thankfully, the CT scan did not show any evidence of cancer growth.  He still had some relatively stable disease.  He has the pulmonary nodules.  We had had SBRT, this was improved.  He still had the retroperitoneal mass measuring 5.6 x 3.9 cm.  He has had no problems with his appetite.  He is eating better.  He is more active now.  He has had no bleeding.  He said no change in bowel or bladder habits.  He is a little bit constipated.  He has had a little bit of rash on the ankles.  I will call in some Lotrimin cream.  Currently, I would have to say that his performance status is probably ECOG 1.    Medications:  Current Outpatient Medications:    aspirin 81 MG EC tablet, Take 1 tablet by mouth daily., Disp: , Rfl:    axitinib  (INLYTA ) 5 MG tablet, Take 1 tablet (5 mg total) by mouth 2 (two) times daily., Disp: 60 tablet, Rfl: 6   Cholecalciferol 25 MCG (1000 UT) capsule, Take by mouth daily., Disp: , Rfl:    clotrimazole-betamethasone (LOTRISONE) cream, Apply 1 Application topically 2 (two) times daily., Disp: 30 g, Rfl: 4   famotidine  (PEPCID ) 20 MG tablet, Take by mouth  daily., Disp: , Rfl:    HYDROcodone  bit-homatropine (HYCODAN) 5-1.5 MG/5ML syrup, Take 5 mLs by mouth every 6 (six) hours as needed for cough., Disp: 120 mL, Rfl: 0   lidocaine -prilocaine  (EMLA ) cream, Apply 1 Application topically as needed. Place on port one hour prior to port access and cover with plastic wrap, Disp: 30 g, Rfl: 0   mometasone  (ELOCON ) 0.1 % lotion, Apply topically daily., Disp: 60 mL, Rfl: 2   promethazine  (PHENERGAN ) 25 MG tablet, Take 1 tablet (25 mg total) by mouth every 6 (six) hours as needed for nausea or vomiting., Disp: 30 tablet, Rfl: 0   rosuvastatin (CRESTOR) 20 MG tablet, TAKE 1 TABLET BY MOUTH EVERY DAY FOR CHOLESTEROL, Disp: , Rfl:    telmisartan (MICARDIS) 80 MG tablet, TAKE ONE TABLET BY MOUTH EACH DAY FOR BLOOD PRESSURE, Disp: , Rfl:    amLODipine (NORVASC) 10 MG tablet, Take 10 mg by mouth daily., Disp: , Rfl:  No current facility-administered medications for this visit.  Facility-Administered Medications Ordered in Other Visits:    sodium chloride  flush (NS) 0.9 % injection 10 mL, 10 mL, Intravenous, PRN, Michael Knox R, MD, 10 mL at 11/01/23 9095  Allergies:  Allergies  Allergen Reactions   Atorvastatin Other (See Comments)    Joint pain  Cabozantinib  Diarrhea, Nausea And Vomiting and Rash   Pitavastatin Other (See Comments)    Headache, constipation    Simvastatin Other (See Comments)    Joint pain      Past Medical History, Surgical history, Social history, and Family History were reviewed and updated.  Review of Systems: Review of Systems  Constitutional: Negative.   HENT:  Negative.    Eyes: Negative.   Respiratory: Negative.    Cardiovascular: Negative.   Gastrointestinal: Negative.   Endocrine: Negative.   Genitourinary: Negative.    Musculoskeletal: Negative.   Skin: Negative.   Neurological: Negative.   Hematological: Negative.   Psychiatric/Behavioral: Negative.      Physical Exam:  height is 5' 7 (1.702 m) and  weight is 171 lb (77.6 kg). His oral temperature is 98.2 F (36.8 C). His blood pressure is 138/62 and his pulse is 61. His respiration is 19 and oxygen saturation is 97%.   Wt Readings from Last 3 Encounters:  03/16/24 171 lb (77.6 kg)  02/13/24 169 lb (76.7 kg)  01/11/24 178 lb 1.6 oz (80.8 kg)    Physical Exam Vitals reviewed.  HENT:     Head: Normocephalic and atraumatic.  Eyes:     Pupils: Pupils are equal, round, and reactive to light.  Cardiovascular:     Rate and Rhythm: Normal rate and regular rhythm.     Heart sounds: Normal heart sounds.  Pulmonary:     Effort: Pulmonary effort is normal.     Breath sounds: Normal breath sounds.  Abdominal:     General: Bowel sounds are normal.     Palpations: Abdomen is soft.  Musculoskeletal:        General: No tenderness or deformity. Normal range of motion.     Cervical back: Normal range of motion.  Lymphadenopathy:     Cervical: No cervical adenopathy.  Skin:    General: Skin is warm and dry.     Findings: No erythema or rash.  Neurological:     Mental Status: He is alert and oriented to person, place, and time.  Psychiatric:        Behavior: Behavior normal.        Thought Content: Thought content normal.        Judgment: Judgment normal.      Lab Results  Component Value Date   WBC 4.8 03/16/2024   HGB 11.7 (L) 03/16/2024   HCT 35.4 (L) 03/16/2024   MCV 93.7 03/16/2024   PLT 156 03/16/2024     Chemistry      Component Value Date/Time   NA 142 02/13/2024 0800   K 3.8 02/13/2024 0800   CL 108 02/13/2024 0800   CO2 25 02/13/2024 0800   BUN 26 (H) 02/13/2024 0800   CREATININE 1.80 (H) 02/13/2024 0800      Component Value Date/Time   CALCIUM 9.2 02/13/2024 0800   ALKPHOS 78 02/13/2024 0800   AST 15 02/13/2024 0800   ALT 12 02/13/2024 0800   BILITOT 0.7 02/13/2024 0800     Impression and Plan: Michael Knox is a very nice 78 year old white male.  Again, we are just having trouble finding medicine that he  can take for his kidney cancer.  Since the CT scan looks pretty good, I do not see a need for treatment on him.  For right now, I think we can just follow this along.  I would probably would do another CT scan in about 6 or 7 weeks.  I  think this will tell us  whether or not we need to do any treatment on him.  Again, he really had a tough time with therapy.  I just want him to have a good quality of life.  His wife is much happier now that he is able to to do more.  We will plan to get him back after Labor Day.  We will do the CT scan before we see him back.   7/18/20258:36 AM

## 2024-03-16 NOTE — Patient Instructions (Signed)

## 2024-05-04 ENCOUNTER — Ambulatory Visit (HOSPITAL_BASED_OUTPATIENT_CLINIC_OR_DEPARTMENT_OTHER)
Admission: RE | Admit: 2024-05-04 | Discharge: 2024-05-04 | Disposition: A | Discharge status: Home / Self Care | Source: Ambulatory Visit | Attending: Hematology & Oncology | Admitting: Hematology & Oncology

## 2024-05-04 DIAGNOSIS — N1831 Chronic kidney disease, stage 3a: Secondary | ICD-10-CM | POA: Insufficient documentation

## 2024-05-04 DIAGNOSIS — C649 Malignant neoplasm of unspecified kidney, except renal pelvis: Secondary | ICD-10-CM | POA: Diagnosis present

## 2024-05-04 DIAGNOSIS — R918 Other nonspecific abnormal finding of lung field: Secondary | ICD-10-CM | POA: Diagnosis not present

## 2024-05-04 DIAGNOSIS — J439 Emphysema, unspecified: Secondary | ICD-10-CM | POA: Insufficient documentation

## 2024-05-04 DIAGNOSIS — I700 Atherosclerosis of aorta: Secondary | ICD-10-CM | POA: Insufficient documentation

## 2024-05-04 MED ORDER — HEPARIN SOD (PORK) LOCK FLUSH 100 UNIT/ML IV SOLN
INTRAVENOUS | Status: AC
  Administered 2024-05-04: 500 [IU] via INTRAVENOUS
  Filled 2024-05-04: qty 5

## 2024-05-04 MED ORDER — IOHEXOL 300 MG/ML  SOLN
75.0000 mL | Freq: Once | INTRAMUSCULAR | Status: AC | PRN
  Administered 2024-05-04: 75 mL via INTRAVENOUS

## 2024-05-04 MED ORDER — HEPARIN SOD (PORK) LOCK FLUSH 100 UNIT/ML IV SOLN: 500.0000 [IU] | Freq: Once | INTRAVENOUS | Status: AC

## 2024-05-05 LAB — POCT I-STAT CREATININE: Creatinine, Ser: 1.7 mg/dL — ABNORMAL HIGH (ref 0.61–1.24)

## 2024-05-11 ENCOUNTER — Ambulatory Visit: Payer: Self-pay | Admitting: Hematology & Oncology

## 2024-05-14 ENCOUNTER — Inpatient Hospital Stay: Attending: Hematology & Oncology

## 2024-05-14 ENCOUNTER — Inpatient Hospital Stay

## 2024-05-14 ENCOUNTER — Inpatient Hospital Stay (HOSPITAL_BASED_OUTPATIENT_CLINIC_OR_DEPARTMENT_OTHER): Admitting: Hematology & Oncology

## 2024-05-14 ENCOUNTER — Other Ambulatory Visit: Payer: Self-pay

## 2024-05-14 ENCOUNTER — Encounter: Payer: Self-pay | Admitting: Hematology & Oncology

## 2024-05-14 VITALS — BP 113/51 | HR 50 | Temp 97.4°F | Resp 18 | Ht 67.0 in | Wt 171.0 lb

## 2024-05-14 DIAGNOSIS — C786 Secondary malignant neoplasm of retroperitoneum and peritoneum: Secondary | ICD-10-CM | POA: Insufficient documentation

## 2024-05-14 DIAGNOSIS — C7801 Secondary malignant neoplasm of right lung: Secondary | ICD-10-CM | POA: Diagnosis present

## 2024-05-14 DIAGNOSIS — C641 Malignant neoplasm of right kidney, except renal pelvis: Secondary | ICD-10-CM | POA: Diagnosis present

## 2024-05-14 DIAGNOSIS — C649 Malignant neoplasm of unspecified kidney, except renal pelvis: Secondary | ICD-10-CM

## 2024-05-14 DIAGNOSIS — Z79899 Other long term (current) drug therapy: Secondary | ICD-10-CM | POA: Insufficient documentation

## 2024-05-14 LAB — CBC WITH DIFFERENTIAL (CANCER CENTER ONLY)
Abs Immature Granulocytes: 0.04 K/uL (ref 0.00–0.07)
Basophils Absolute: 0.1 K/uL (ref 0.0–0.1)
Basophils Relative: 1 %
Eosinophils Absolute: 0.5 K/uL (ref 0.0–0.5)
Eosinophils Relative: 10 %
HCT: 40.4 % (ref 39.0–52.0)
Hemoglobin: 13.3 g/dL (ref 13.0–17.0)
Immature Granulocytes: 1 %
Lymphocytes Relative: 16 %
Lymphs Abs: 0.8 K/uL (ref 0.7–4.0)
MCH: 29.6 pg (ref 26.0–34.0)
MCHC: 32.9 g/dL (ref 30.0–36.0)
MCV: 90 fL (ref 80.0–100.0)
Monocytes Absolute: 0.4 K/uL (ref 0.1–1.0)
Monocytes Relative: 9 %
Neutro Abs: 3.1 K/uL (ref 1.7–7.7)
Neutrophils Relative %: 63 %
Platelet Count: 150 K/uL (ref 150–400)
RBC: 4.49 MIL/uL (ref 4.22–5.81)
RDW: 13.6 % (ref 11.5–15.5)
WBC Count: 4.8 K/uL (ref 4.0–10.5)
nRBC: 0 % (ref 0.0–0.2)

## 2024-05-14 LAB — CMP (CANCER CENTER ONLY)
ALT: 16 U/L (ref 0–44)
AST: 22 U/L (ref 15–41)
Albumin: 4.4 g/dL (ref 3.5–5.0)
Alkaline Phosphatase: 86 U/L (ref 38–126)
Anion gap: 12 (ref 5–15)
BUN: 25 mg/dL — ABNORMAL HIGH (ref 8–23)
CO2: 22 mmol/L (ref 22–32)
Calcium: 9.2 mg/dL (ref 8.9–10.3)
Chloride: 106 mmol/L (ref 98–111)
Creatinine: 1.76 mg/dL — ABNORMAL HIGH (ref 0.61–1.24)
GFR, Estimated: 39 mL/min — ABNORMAL LOW (ref >60)
Glucose, Bld: 98 mg/dL (ref 70–99)
Potassium: 4.3 mmol/L (ref 3.5–5.1)
Sodium: 140 mmol/L (ref 135–145)
Total Bilirubin: 0.7 mg/dL (ref 0.0–1.2)
Total Protein: 7.3 g/dL (ref 6.5–8.1)

## 2024-05-14 LAB — LACTATE DEHYDROGENASE: LDH: 175 U/L (ref 98–192)

## 2024-05-14 MED ORDER — MONTELUKAST SODIUM 10 MG PO TABS: 10.0000 mg | ORAL_TABLET | Freq: Every day | ORAL | 5 refills | Status: AC

## 2024-05-14 NOTE — Telephone Encounter (Signed)
-----   Message from Maude JONELLE Crease sent at 05/11/2024  3:05 PM EDT ----- Please call and let him know that everything looks stable.  Nothing appears to be new or progressive.  Thanks.SABRA ----- Message ----- From: Interface, Rad Results In Sent: 05/11/2024   2:04 PM EDT To: Maude JONELLE Crease, MD

## 2024-05-14 NOTE — Progress Notes (Signed)
 Hematology and Oncology Follow Up Visit  Michael Knox [id number] 1945/12/20 78 y.o. 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  He still has itching.  I was not sure as to why he has the itching.  He has been off the Inlyta  for about 5 months.  I will try him on some Singulair  to see if this may help with the itching.  Very did do a CT scan on him.  This was done on 05/04/2024.  It showed a new right axillary lymph node measuring only 6 mm.  I also that everything else was stable.  The retroperitoneal mass still measures 5.6 x 3.8 cm.  He has pulmonary nodules that are stable.  He really does not do all that much.  He and his wife just hang out together.  His appetite is doing good he has had no nausea or vomiting.  He has had no change in bowel or bladder habits.  He has not noted any fever.  Thankfully, he has had no problems with COVID.  Overall, I would say that his performance status is probably ECOG 1.   Medications:  Current Outpatient Medications:    amLODipine (NORVASC) 10 MG tablet, Take 10 mg by mouth daily., Disp: , Rfl:    aspirin 81 MG EC tablet, Take 1 tablet by mouth daily., Disp: , Rfl:    axitinib  (INLYTA ) 5 MG tablet, Take 1 tablet (5 mg total) by mouth 2 (two) times daily., Disp: 60 tablet, Rfl: 6   Cholecalciferol 25 MCG (1000 UT) capsule, Take by mouth daily., Disp: , Rfl:    clotrimazole -betamethasone  (LOTRISONE ) cream, Apply 1 Application topically 2 (two) times daily., Disp:  30 g, Rfl: 4   famotidine  (PEPCID ) 20 MG tablet, Take by mouth daily., Disp: , Rfl:    HYDROcodone  bit-homatropine (HYCODAN) 5-1.5 MG/5ML syrup, Take 5 mLs by mouth every 6 (six) hours as needed for cough., Disp: 120 mL, Rfl: 0   lidocaine -prilocaine  (EMLA ) cream, Apply 1 Application topically as needed. Place on port one hour prior to port access and cover with plastic wrap, Disp: 30 g, Rfl: 0   mometasone  (ELOCON ) 0.1 % lotion, Apply topically daily., Disp: 60 mL, Rfl: 2   promethazine  (PHENERGAN ) 25 MG tablet, Take 1 tablet (25 mg total) by mouth every 6 (six) hours as needed for nausea or vomiting., Disp: 30 tablet, Rfl: 0   rosuvastatin (CRESTOR) 20 MG tablet, TAKE 1 TABLET BY MOUTH EVERY DAY FOR CHOLESTEROL, Disp: , Rfl:    telmisartan (MICARDIS) 80 MG tablet, TAKE ONE TABLET BY MOUTH EACH DAY FOR BLOOD PRESSURE, Disp: , Rfl:  No current facility-administered medications for this visit.  Facility-Administered Medications Ordered in Other Visits:    sodium chloride  flush (NS) 0.9 % injection 10 mL, 10 mL, Intravenous, PRN, Burtis Imhoff R, MD, 10 mL at 11/01/23 9095  Allergies:  Allergies  Allergen Reactions   Atorvastatin Other (See Comments)    Joint pain     Cabozantinib  Diarrhea,  Nausea And Vomiting and Rash   Pitavastatin Other (See Comments)    Headache, constipation    Simvastatin Other (See Comments)    Joint pain      Past Medical History, Surgical history, Social history, and Family History were reviewed and updated.  Review of Systems: Review of Systems  Constitutional: Negative.   HENT:  Negative.    Eyes: Negative.   Respiratory: Negative.    Cardiovascular: Negative.   Gastrointestinal: Negative.   Endocrine: Negative.   Genitourinary: Negative.    Musculoskeletal: Negative.   Skin: Negative.   Neurological: Negative.   Hematological: Negative.   Psychiatric/Behavioral: Negative.      Physical Exam:  height is 5' 7 (1.702 m) and weight is 171 lb  (77.6 kg). His oral temperature is 97.4 F (36.3 C) (abnormal). His blood pressure is 113/51 (abnormal) and his pulse is 50 (abnormal). His respiration is 18 and oxygen saturation is 100%.   Wt Readings from Last 3 Encounters:  05/14/24 171 lb (77.6 kg)  03/16/24 171 lb (77.6 kg)  02/13/24 169 lb (76.7 kg)    Physical Exam Vitals reviewed.  HENT:     Head: Normocephalic and atraumatic.  Eyes:     Pupils: Pupils are equal, round, and reactive to light.  Cardiovascular:     Rate and Rhythm: Normal rate and regular rhythm.     Heart sounds: Normal heart sounds.  Pulmonary:     Effort: Pulmonary effort is normal.     Breath sounds: Normal breath sounds.  Abdominal:     General: Bowel sounds are normal.     Palpations: Abdomen is soft.  Musculoskeletal:        General: No tenderness or deformity. Normal range of motion.     Cervical back: Normal range of motion.  Lymphadenopathy:     Cervical: No cervical adenopathy.  Skin:    General: Skin is warm and dry.     Findings: No erythema or rash.  Neurological:     Mental Status: He is alert and oriented to person, place, and time.  Psychiatric:        Behavior: Behavior normal.        Thought Content: Thought content normal.        Judgment: Judgment normal.      Lab Results  Component Value Date   WBC 4.8 05/14/2024   HGB 13.3 05/14/2024   HCT 40.4 05/14/2024   MCV 90.0 05/14/2024   PLT 150 05/14/2024     Chemistry      Component Value Date/Time   NA 141 03/16/2024 0750   K 4.1 03/16/2024 0750   CL 109 03/16/2024 0750   CO2 25 03/16/2024 0750   BUN 28 (H) 03/16/2024 0750   CREATININE 1.70 (H) 05/04/2024 1123   CREATININE 1.70 (H) 03/16/2024 0750      Component Value Date/Time   CALCIUM 9.2 03/16/2024 0750   ALKPHOS 71 03/16/2024 0750   AST 13 (L) 03/16/2024 0750   ALT 9 03/16/2024 0750   BILITOT 0.5 03/16/2024 0750     Impression and Plan: Michael Knox is a very nice 78 year old white male.  Again, we are  just having trouble finding medicine that he can take for his kidney cancer.  Since the CT scan looks pretty good, I do not see a need for treatment on him.  We will have to be careful with his right axillary lymph node.  I think we will do another scan on him right  before Thanksgiving.  Hopefully, the Singulair  will help him.  I would like to get him back in about a month.    9/15/20259:55 AM

## 2024-05-14 NOTE — Patient Instructions (Signed)

## 2024-05-14 NOTE — Telephone Encounter (Signed)
 Advised via MyChart.

## 2024-06-25 ENCOUNTER — Inpatient Hospital Stay

## 2024-06-25 ENCOUNTER — Inpatient Hospital Stay (HOSPITAL_BASED_OUTPATIENT_CLINIC_OR_DEPARTMENT_OTHER): Admitting: Hematology & Oncology

## 2024-06-25 ENCOUNTER — Encounter: Payer: Self-pay | Admitting: Hematology & Oncology

## 2024-06-25 ENCOUNTER — Inpatient Hospital Stay: Attending: Hematology & Oncology

## 2024-06-25 VITALS — BP 120/54 | HR 55 | Resp 20 | Ht 67.0 in | Wt 171.0 lb

## 2024-06-25 DIAGNOSIS — T7840XA Allergy, unspecified, initial encounter: Secondary | ICD-10-CM | POA: Diagnosis not present

## 2024-06-25 DIAGNOSIS — C649 Malignant neoplasm of unspecified kidney, except renal pelvis: Secondary | ICD-10-CM

## 2024-06-25 DIAGNOSIS — C7800 Secondary malignant neoplasm of unspecified lung: Secondary | ICD-10-CM

## 2024-06-25 DIAGNOSIS — C641 Malignant neoplasm of right kidney, except renal pelvis: Secondary | ICD-10-CM | POA: Insufficient documentation

## 2024-06-25 DIAGNOSIS — C7801 Secondary malignant neoplasm of right lung: Secondary | ICD-10-CM | POA: Diagnosis present

## 2024-06-25 DIAGNOSIS — C786 Secondary malignant neoplasm of retroperitoneum and peritoneum: Secondary | ICD-10-CM | POA: Insufficient documentation

## 2024-06-25 DIAGNOSIS — R21 Rash and other nonspecific skin eruption: Secondary | ICD-10-CM

## 2024-06-25 DIAGNOSIS — Z79899 Other long term (current) drug therapy: Secondary | ICD-10-CM | POA: Insufficient documentation

## 2024-06-25 LAB — CBC WITH DIFFERENTIAL (CANCER CENTER ONLY)
Abs Immature Granulocytes: 0.01 K/uL (ref 0.00–0.07)
Basophils Absolute: 0.1 K/uL (ref 0.0–0.1)
Basophils Relative: 2 %
Eosinophils Absolute: 0.5 K/uL (ref 0.0–0.5)
Eosinophils Relative: 11 %
HCT: 39.5 % (ref 39.0–52.0)
Hemoglobin: 12.9 g/dL — ABNORMAL LOW (ref 13.0–17.0)
Immature Granulocytes: 0 %
Lymphocytes Relative: 20 %
Lymphs Abs: 1 K/uL (ref 0.7–4.0)
MCH: 29.4 pg (ref 26.0–34.0)
MCHC: 32.7 g/dL (ref 30.0–36.0)
MCV: 90 fL (ref 80.0–100.0)
Monocytes Absolute: 0.4 K/uL (ref 0.1–1.0)
Monocytes Relative: 9 %
Neutro Abs: 3 K/uL (ref 1.7–7.7)
Neutrophils Relative %: 58 %
Platelet Count: 137 K/uL — ABNORMAL LOW (ref 150–400)
RBC: 4.39 MIL/uL (ref 4.22–5.81)
RDW: 14.4 % (ref 11.5–15.5)
WBC Count: 5 K/uL (ref 4.0–10.5)
nRBC: 0 % (ref 0.0–0.2)

## 2024-06-25 LAB — CMP (CANCER CENTER ONLY)
ALT: 21 U/L (ref 0–44)
AST: 24 U/L (ref 15–41)
Albumin: 4.4 g/dL (ref 3.5–5.0)
Alkaline Phosphatase: 98 U/L (ref 38–126)
Anion gap: 12 (ref 5–15)
BUN: 25 mg/dL — ABNORMAL HIGH (ref 8–23)
CO2: 20 mmol/L — ABNORMAL LOW (ref 22–32)
Calcium: 9.4 mg/dL (ref 8.9–10.3)
Chloride: 108 mmol/L (ref 98–111)
Creatinine: 1.6 mg/dL — ABNORMAL HIGH (ref 0.61–1.24)
GFR, Estimated: 43 mL/min — ABNORMAL LOW (ref >60)
Glucose, Bld: 102 mg/dL — ABNORMAL HIGH (ref 70–99)
Potassium: 4.4 mmol/L (ref 3.5–5.1)
Sodium: 140 mmol/L (ref 135–145)
Total Bilirubin: 0.6 mg/dL (ref 0.0–1.2)
Total Protein: 7.1 g/dL (ref 6.5–8.1)

## 2024-06-25 LAB — LACTATE DEHYDROGENASE: LDH: 156 U/L (ref 98–192)

## 2024-06-25 LAB — TSH: TSH: 1.81 u[IU]/mL (ref 0.350–4.500)

## 2024-06-25 NOTE — Patient Instructions (Signed)

## 2024-06-25 NOTE — Progress Notes (Signed)
 Hematology and Oncology Follow Up Visit  Michael Knox [id number] 1945-09-25 78 y.o. 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 Michael Knox is in for follow-up.  Clearly, we have to get him to a dermatologist now.  This pruritus and rash just is not getting any better.  He has the rash on his left shoulder.  This almost looks like psoriasis.  He has a macular type rash elsewhere.  Has a lot of itching and a lot of excoriations.  Again he has not had any kind of treatment now for probably 6 months.  I am just surprised that he still has the rash and the itching.  I have him on Singulair .  If this does not seem to be doing all that much.  He has had no fever.  He has had no nausea or vomiting.  He is eating quite well.  He has had no change in bowel or bladder habits.  He has had no headache.  He has had a little bit of a cough.  Overall, I would have to say that his performance status is probably ECOG 1.   Medications:  Current Outpatient Medications:    amLODipine (NORVASC) 10 MG tablet, Take 10 mg by mouth daily., Disp: , Rfl:    aspirin 81 MG EC tablet, Take 1 tablet by mouth daily., Disp: , Rfl:    Cholecalciferol 25 MCG (1000 UT) capsule, Take by mouth daily., Disp: , Rfl:    clotrimazole -betamethasone  (LOTRISONE ) cream, Apply 1 Application topically 2 (two) times daily., Disp: 30 g, Rfl: 4   famotidine  (PEPCID ) 20 MG tablet, Take by mouth daily., Disp: , Rfl:    lidocaine -prilocaine  (EMLA ) cream, Apply 1 Application topically as  needed. Place on port one hour prior to port access and cover with plastic wrap, Disp: 30 g, Rfl: 0   mometasone  (ELOCON ) 0.1 % lotion, Apply topically daily., Disp: 60 mL, Rfl: 2   montelukast  (SINGULAIR ) 10 MG tablet, Take 1 tablet (10 mg total) by mouth at bedtime., Disp: 30 tablet, Rfl: 5   promethazine  (PHENERGAN ) 25 MG tablet, Take 1 tablet (25 mg total) by mouth every 6 (six) hours as needed for nausea or vomiting., Disp: 30 tablet, Rfl: 0   rosuvastatin (CRESTOR) 20 MG tablet, TAKE 1 TABLET BY MOUTH EVERY DAY FOR CHOLESTEROL, Disp: , Rfl:    telmisartan (MICARDIS) 80 MG tablet, TAKE ONE TABLET BY MOUTH EACH DAY FOR BLOOD PRESSURE, Disp: , Rfl:  No current facility-administered medications for this visit.  Facility-Administered Medications Ordered in Other Visits:    sodium chloride  flush (NS) 0.9 % injection 10 mL, 10 mL, Intravenous, PRN, Sukhman Martine R, MD, 10 mL at 11/01/23 9095  Allergies:  Allergies  Allergen Reactions   Atorvastatin Other (See Comments)    Joint pain     Cabozantinib  Diarrhea, Nausea And Vomiting and Rash   Pitavastatin Other (See Comments)    Headache, constipation    Simvastatin Other (See Comments)    Joint pain  Past Medical History, Surgical history, Social history, and Family History were reviewed and updated.  Review of Systems: Review of Systems  Constitutional: Negative.   HENT:  Negative.    Eyes: Negative.   Respiratory: Negative.    Cardiovascular: Negative.   Gastrointestinal: Negative.   Endocrine: Negative.   Genitourinary: Negative.    Musculoskeletal: Negative.   Skin: Negative.   Neurological: Negative.   Hematological: Negative.   Psychiatric/Behavioral: Negative.      Physical Exam:  height is 5' 7 (1.702 m) and weight is 171 lb (77.6 kg). His blood pressure is 120/54 (abnormal) and his pulse is 55 (abnormal). His respiration is 20 and oxygen saturation is 100%.   Wt Readings from Last 3 Encounters:  06/25/24  171 lb (77.6 kg)  05/14/24 171 lb (77.6 kg)  03/16/24 171 lb (77.6 kg)    Physical Exam Vitals reviewed.  HENT:     Head: Normocephalic and atraumatic.  Eyes:     Pupils: Pupils are equal, round, and reactive to light.  Cardiovascular:     Rate and Rhythm: Normal rate and regular rhythm.     Heart sounds: Normal heart sounds.  Pulmonary:     Effort: Pulmonary effort is normal.     Breath sounds: Normal breath sounds.  Abdominal:     General: Bowel sounds are normal.     Palpations: Abdomen is soft.  Musculoskeletal:        General: No tenderness or deformity. Normal range of motion.     Cervical back: Normal range of motion.  Lymphadenopathy:     Cervical: No cervical adenopathy.  Skin:    General: Skin is warm and dry.     Findings: No erythema or rash.     Comments: On the anterior portion of his left shoulder, he has a large macular erythematous area.  It is quite scaly.  It is somewhat thickened.  On his body, he has a little papules.  The has excoriations.  He has had a lot of marks where he was itching it.  Neurological:     Mental Status: He is alert and oriented to person, place, and time.  Psychiatric:        Behavior: Behavior normal.        Thought Content: Thought content normal.        Judgment: Judgment normal.      Lab Results  Component Value Date   WBC 4.8 05/14/2024   HGB 13.3 05/14/2024   HCT 40.4 05/14/2024   MCV 90.0 05/14/2024   PLT 150 05/14/2024     Chemistry      Component Value Date/Time   NA 140 05/14/2024 0914   K 4.3 05/14/2024 0914   CL 106 05/14/2024 0914   CO2 22 05/14/2024 0914   BUN 25 (H) 05/14/2024 0914   CREATININE 1.76 (H) 05/14/2024 0914      Component Value Date/Time   CALCIUM 9.2 05/14/2024 0914   ALKPHOS 86 05/14/2024 0914   AST 22 05/14/2024 0914   ALT 16 05/14/2024 0914   BILITOT 0.7 05/14/2024 0914     Impression and Plan: Mr. Michael Knox is a very nice 78 year old white male.  Again, we are just having  trouble finding medicine that he can take for his kidney cancer.  Her problem now is his rash.  Again, we will have to see about get him over to Dermatology.  Maybe they can help us  out.  We probably had to get him set up  with another scan.  I will probably get this set up probably in about a month or so.  We might want to do it between Thanksgiving and Christmas.  Apparently, they are going to be pay more for CT scans next year.  We really need to get scans on him this year.   10/27/20258:47 AM

## 2024-07-05 ENCOUNTER — Telehealth: Payer: Self-pay

## 2024-07-05 NOTE — Telephone Encounter (Signed)
 Faxed referral to Dr. Norleen Hall/Dermatology.

## 2024-08-03 ENCOUNTER — Ambulatory Visit (HOSPITAL_BASED_OUTPATIENT_CLINIC_OR_DEPARTMENT_OTHER)
Admission: RE | Admit: 2024-08-03 | Discharge: 2024-08-03 | Disposition: A | Source: Ambulatory Visit | Attending: Hematology & Oncology | Admitting: Hematology & Oncology

## 2024-08-03 DIAGNOSIS — C649 Malignant neoplasm of unspecified kidney, except renal pelvis: Secondary | ICD-10-CM

## 2024-08-03 LAB — POCT I-STAT CREATININE: Creatinine, Ser: 1.9 mg/dL — ABNORMAL HIGH (ref 0.61–1.24)

## 2024-08-03 MED ORDER — IOHEXOL 300 MG/ML  SOLN
80.0000 mL | Freq: Once | INTRAMUSCULAR | Status: AC | PRN
  Administered 2024-08-03: 80 mL via INTRAVENOUS

## 2024-08-03 MED ORDER — HEPARIN SOD (PORK) LOCK FLUSH 100 UNIT/ML IV SOLN
500.0000 [IU] | Freq: Once | INTRAVENOUS | Status: AC
  Administered 2024-08-03: 500 [IU] via INTRAVENOUS

## 2024-08-13 ENCOUNTER — Other Ambulatory Visit: Payer: Self-pay

## 2024-08-13 ENCOUNTER — Telehealth: Payer: Self-pay

## 2024-08-13 ENCOUNTER — Inpatient Hospital Stay

## 2024-08-13 ENCOUNTER — Inpatient Hospital Stay: Admitting: Hematology & Oncology

## 2024-08-13 ENCOUNTER — Other Ambulatory Visit (HOSPITAL_COMMUNITY): Payer: Self-pay

## 2024-08-13 ENCOUNTER — Inpatient Hospital Stay: Attending: Hematology & Oncology

## 2024-08-13 ENCOUNTER — Encounter: Payer: Self-pay | Admitting: Hematology & Oncology

## 2024-08-13 ENCOUNTER — Telehealth: Payer: Self-pay | Admitting: Pharmacist

## 2024-08-13 VITALS — BP 131/55 | HR 51 | Temp 97.6°F | Resp 18 | Ht 67.0 in | Wt 172.0 lb

## 2024-08-13 DIAGNOSIS — C7989 Secondary malignant neoplasm of other specified sites: Secondary | ICD-10-CM

## 2024-08-13 DIAGNOSIS — C772 Secondary and unspecified malignant neoplasm of intra-abdominal lymph nodes: Secondary | ICD-10-CM | POA: Insufficient documentation

## 2024-08-13 DIAGNOSIS — C7801 Secondary malignant neoplasm of right lung: Secondary | ICD-10-CM | POA: Insufficient documentation

## 2024-08-13 DIAGNOSIS — C649 Malignant neoplasm of unspecified kidney, except renal pelvis: Secondary | ICD-10-CM | POA: Diagnosis not present

## 2024-08-13 DIAGNOSIS — C641 Malignant neoplasm of right kidney, except renal pelvis: Secondary | ICD-10-CM | POA: Diagnosis present

## 2024-08-13 DIAGNOSIS — C786 Secondary malignant neoplasm of retroperitoneum and peritoneum: Secondary | ICD-10-CM | POA: Diagnosis present

## 2024-08-13 LAB — CMP (CANCER CENTER ONLY)
ALT: 18 U/L (ref 0–44)
AST: 23 U/L (ref 15–41)
Albumin: 4.3 g/dL (ref 3.5–5.0)
Alkaline Phosphatase: 87 U/L (ref 38–126)
Anion gap: 10 (ref 5–15)
BUN: 22 mg/dL (ref 8–23)
CO2: 22 mmol/L (ref 22–32)
Calcium: 8.5 mg/dL — ABNORMAL LOW (ref 8.9–10.3)
Chloride: 109 mmol/L (ref 98–111)
Creatinine: 1.54 mg/dL — ABNORMAL HIGH (ref 0.61–1.24)
GFR, Estimated: 45 mL/min — ABNORMAL LOW (ref >60)
Glucose, Bld: 106 mg/dL — ABNORMAL HIGH (ref 70–99)
Potassium: 4 mmol/L (ref 3.5–5.1)
Sodium: 142 mmol/L (ref 135–145)
Total Bilirubin: 0.6 mg/dL (ref 0.0–1.2)
Total Protein: 6.8 g/dL (ref 6.5–8.1)

## 2024-08-13 LAB — CBC WITH DIFFERENTIAL (CANCER CENTER ONLY)
Abs Immature Granulocytes: 0.01 K/uL (ref 0.00–0.07)
Basophils Absolute: 0.1 K/uL (ref 0.0–0.1)
Basophils Relative: 1 %
Eosinophils Absolute: 0.4 K/uL (ref 0.0–0.5)
Eosinophils Relative: 8 %
HCT: 38.3 % — ABNORMAL LOW (ref 39.0–52.0)
Hemoglobin: 12.6 g/dL — ABNORMAL LOW (ref 13.0–17.0)
Immature Granulocytes: 0 %
Lymphocytes Relative: 17 %
Lymphs Abs: 0.9 K/uL (ref 0.7–4.0)
MCH: 29.9 pg (ref 26.0–34.0)
MCHC: 32.9 g/dL (ref 30.0–36.0)
MCV: 91 fL (ref 80.0–100.0)
Monocytes Absolute: 0.5 K/uL (ref 0.1–1.0)
Monocytes Relative: 9 %
Neutro Abs: 3.2 K/uL (ref 1.7–7.7)
Neutrophils Relative %: 65 %
Platelet Count: 148 K/uL — ABNORMAL LOW (ref 150–400)
RBC: 4.21 MIL/uL — ABNORMAL LOW (ref 4.22–5.81)
RDW: 14.3 % (ref 11.5–15.5)
WBC Count: 5 K/uL (ref 4.0–10.5)
nRBC: 0 % (ref 0.0–0.2)

## 2024-08-13 LAB — LACTATE DEHYDROGENASE: LDH: 168 U/L (ref 105–235)

## 2024-08-13 MED ORDER — TIVOZANIB HCL 1.34 MG PO CAPS
1.3400 mg | ORAL_CAPSULE | Freq: Every day | ORAL | 4 refills | Status: AC
  Filled 2024-08-14: qty 21, 28d supply, fill #0
  Filled 2024-09-07: qty 21, 28d supply, fill #1
  Filled 2024-10-03: qty 21, 28d supply, fill #2

## 2024-08-13 NOTE — Telephone Encounter (Signed)
 Oral Oncology Patient Advocate Encounter   Received notification that prior authorization for [employer]  is required.   PA submitted on 08/13/2024 Key [id number] Status is pending      Charlott Hamilton,  CPhT-Adv  she/her/hers [facility]  [facility] Pharmacy Technician Patient Advocate Specialist III [facility] Phone: [phone]  Fax: [phone] [email or web address]

## 2024-08-13 NOTE — Progress Notes (Signed)
 Hematology and Oncology Follow Up Visit  Michael Knox [id number] [birth year] 78 y.o. 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  He is doing okay.  His skin is doing a lot better.  He did see dermatology.  They have been very good we will give him creams to try to help with his rash.  His left shoulder rash looks a whole lot better.  I think the issue now is that his renal cell carcinoma is progressing.  He has been off treatment now for about 7 months.  We did do a CT scan on him.  This was done on 08/03/2024.  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  Outside of that everything else looked okay.  There is slight decrease in right retroperitoneal nodal mass.  I do think we are going to have to get him on treatment.  I think a good option for him would be Fotivda .  I think he would be able to manage this.  I talked to he and his family about this.  He has had a good appetite.  He has had no change in bowel or bladder habits.  He has had no problems with nausea or vomiting.  There has been no leg swelling.  He has had no rashes.  He has had no headache.  Overall, I will say that his performance status is probably ECOG 1.  Medications:  Current Outpatient Medications:    amLODipine (NORVASC) 10 MG tablet, Take 10 mg by mouth daily., Disp: , Rfl:    aspirin 81 MG EC tablet, Take 1 tablet by mouth daily., Disp: , Rfl:    Cholecalciferol 25 MCG (1000 UT) capsule,  Take by mouth daily., Disp: , Rfl:    clotrimazole -betamethasone  (LOTRISONE ) cream, Apply 1 Application topically 2 (two) times daily., Disp: 30 g, Rfl: 4   famotidine  (PEPCID ) 20 MG tablet, Take by mouth daily., Disp: , Rfl:    lidocaine -prilocaine  (EMLA ) cream, Apply 1 Application topically as needed. Place on port one hour prior to port access and cover with plastic wrap, Disp: 30 g, Rfl: 0   mometasone  (ELOCON ) 0.1 % lotion, Apply topically daily., Disp: 60 mL, Rfl: 2   montelukast  (SINGULAIR ) 10 MG tablet, Take 1 tablet (10 mg total) by mouth at bedtime., Disp: 30 tablet, Rfl: 5   promethazine  (PHENERGAN ) 25 MG tablet, Take 1 tablet (25 mg total) by mouth every 6 (six) hours as needed for nausea or vomiting., Disp: 30 tablet, Rfl: 0   rosuvastatin (CRESTOR) 20 MG tablet, TAKE 1 TABLET BY MOUTH EVERY DAY FOR CHOLESTEROL, Disp: , Rfl:    telmisartan (MICARDIS) 80 MG tablet, TAKE ONE TABLET BY MOUTH EACH DAY FOR BLOOD PRESSURE, Disp: , Rfl:  No current facility-administered medications for this visit.  Facility-Administered Medications Ordered in Other Visits:    sodium chloride  flush (NS) 0.9 % injection 10 mL, 10 mL, Intravenous, PRN, Navdeep Fessenden R, MD, 10 mL at 11/01/23  9095  Allergies:  Allergies  Allergen Reactions   Atorvastatin Other (See Comments)    Joint pain     Cabozantinib  Diarrhea, Nausea And Vomiting and Rash   Pitavastatin Other (See Comments)    Headache, constipation    Simvastatin Other (See Comments)    Joint pain      Past Medical History, Surgical history, Social history, and Family History were reviewed and updated.  Review of Systems: Review of Systems  Constitutional: Negative.   HENT:  Negative.    Eyes: Negative.   Respiratory: Negative.    Cardiovascular: Negative.   Gastrointestinal: Negative.   Endocrine: Negative.   Genitourinary: Negative.    Musculoskeletal: Negative.   Skin: Negative.   Neurological: Negative.   Hematological:  Negative.   Psychiatric/Behavioral: Negative.      Physical Exam:  height is 5' 7 (1.702 m) and weight is 172 lb (78 kg). His oral temperature is 97.6 F (36.4 C). His blood pressure is 131/55 (abnormal) and his pulse is 51 (abnormal). His respiration is 18 and oxygen saturation is 100%.   Wt Readings from Last 3 Encounters:  08/13/24 172 lb (78 kg)  06/25/24 171 lb (77.6 kg)  05/14/24 171 lb (77.6 kg)    Physical Exam Vitals reviewed.  HENT:     Head: Normocephalic and atraumatic.  Eyes:     Pupils: Pupils are equal, round, and reactive to light.  Cardiovascular:     Rate and Rhythm: Normal rate and regular rhythm.     Heart sounds: Normal heart sounds.  Pulmonary:     Effort: Pulmonary effort is normal.     Breath sounds: Normal breath sounds.  Abdominal:     General: Bowel sounds are normal.     Palpations: Abdomen is soft.  Musculoskeletal:        General: No tenderness or deformity. Normal range of motion.     Cervical back: Normal range of motion.  Lymphadenopathy:     Cervical: No cervical adenopathy.  Skin:    General: Skin is warm and dry.     Findings: No erythema or rash.     Comments: On the anterior portion of his left shoulder, he has a large macular erythematous area.  It is quite scaly.  It is somewhat thickened.  On his body, he has a little papules.  The has excoriations.  He has had a lot of marks where he was itching it.  Neurological:     Mental Status: He is alert and oriented to person, place, and time.  Psychiatric:        Behavior: Behavior normal.        Thought Content: Thought content normal.        Judgment: Judgment normal.      Lab Results  Component Value Date   WBC 5.0 08/13/2024   HGB 12.6 (L) 08/13/2024   HCT 38.3 (L) 08/13/2024   MCV 91.0 08/13/2024   PLT 148 (L) 08/13/2024     Chemistry      Component Value Date/Time   NA 142 08/13/2024 0905   K 4.0 08/13/2024 0905   CL 109 08/13/2024 0905   CO2 22 08/13/2024 0905    BUN 22 08/13/2024 0905   CREATININE 1.54 (H) 08/13/2024 0905      Component Value Date/Time   CALCIUM 8.5 (L) 08/13/2024 0905   ALKPHOS 87 08/13/2024 0905   AST 23 08/13/2024 0905   ALT 18 08/13/2024 0905   BILITOT 0.6 08/13/2024 0905  Impression and Plan: Mr. Penna is a very nice 78 year old white male.  Again, we are just having trouble finding medicine that he can take for his kidney cancer.  Hopefully, we will be able to get a response with Fotivda .  I am very happy that Dermatology has really helped with the rash.  For right now, we will go ahead and give him on Fotivda .  I probably would give this a good 2 or 3 months before I would do another scan on him.  Hopefully, he will be able to tolerate the full dose.  I will plan to get him back to see me in about a month or so.   12/15/202510:35 AM

## 2024-08-13 NOTE — Telephone Encounter (Addendum)
 Oral Oncology Pharmacist Encounter  Received new prescription for Fotivda  (tivozanib) for the treatment of metastatic renal cell carcinoma, planned duration until disease progression or unacceptable drug toxicity.  CBC w/ Diff and CMP from 08/13/24 assessed, noted patient Scr of 1.54 mg/dL (CrCl ~64.1 mL/min) - no baseline renal dose adjustments required for Fotivda . Patient has TSH available from 06/25/24 - TSH WNL. Prescription dose and frequency assessed for appropriateness.  Current medication list in Epic reviewed, no relevant/significant DDIs with Fotivda  identified.  Evaluated chart and no patient barriers to medication adherence noted.   Prescription has been e-scribed to the [facility] for benefits analysis and approval.  Oral Oncology Clinic will continue to follow for insurance authorization, copayment issues, initial counseling and start date.  Michael Knox, PharmD, BCPS, BCOP Hematology/Oncology Clinical Pharmacist [phone] 08/13/2024 11:30 AM

## 2024-08-13 NOTE — Telephone Encounter (Signed)
 Oral Oncology Patient Advocate Encounter  Prior Authorization for Fotivda  has been approved.    PA# [id number] Effective dates: 08/13/2024 through 08/29/2025  Patients co-pay is $0.00     Charlott Hamilton,  CPhT-Adv  she/her/hers [facility]  [facility] Specialty Pharmacy Services Pharmacy Technician Patient Advocate Specialist III [facility] Phone: [phone]  Fax: [phone] [email or web address]

## 2024-08-14 ENCOUNTER — Other Ambulatory Visit: Payer: Self-pay

## 2024-08-14 DIAGNOSIS — C649 Malignant neoplasm of unspecified kidney, except renal pelvis: Secondary | ICD-10-CM

## 2024-08-14 MED ORDER — ONDANSETRON HCL 8 MG PO TABS: 8.0000 mg | ORAL_TABLET | Freq: Three times a day (TID) | ORAL | 2 refills | Status: AC | PRN

## 2024-08-14 NOTE — Telephone Encounter (Signed)
 Oral Chemotherapy Pharmacist Encounter   Patient Education I spoke with patient and patient's wife, Michael Knox, for overview of new oral chemotherapy medication: Fotivda  (tivozanib) for the treatment of metastatic clear-cell renal cell carcinoma, planned duration until disease progression or unacceptable drug toxicity.   Treatment goal: Palliative   Counseled on administration, dosing, side effects, monitoring, drug-food interactions, safe handling, storage, and disposal.   Patient will take Fotivda  1.34 mg capsule, 1 capsule (1.34 mg total) by mouth daily for 21 days on, then off for 7 days. Repeat every 28 days.   Start date: 08/20/24   Side effects include but not limited to: Decreased Blood Counts, Nausea/Vomiting, Diarrhea, Fatigue, Changes in electrolytes (increase glucose, decreased sodium), Hypertension, Decreased Appetite, Dysphonia. Rare but serious cardiac side effects discussed.      Side Effect Management: Nausea/Vomiting: Will reach out to MD team for anti-emetic to be sent to patient's pharmacy - patient knows to use anti-emetic if needed for nausea/vomiting.  Diarrhea: Patient will obtain Imodium (loperamide) to have on hand if they experience diarrhea. Patient knows to alert the office of 4 or more loose stools above baseline. Hypertension: Discussed with patient regarding keeping a blood pressure log at home and alerting the office if patient starts having an uptrend in blood pressure readings. Impaired Wound Healing: Patient is aware to alert the office if they have a major surgery planned as Fotivda  would need to be held at least 24 days prior to surgery and not resumed until adequate wound healing.    Reviewed with patient importance of keeping a medication schedule and plan for any missed doses. No Michael to medication adherence identified.  Medication reconciliation performed and medication/allergy list updated.  Distress thermometer flowsheet: Distress thermometer not  completed during telephone call as patient has been on previous lines of therapy.   Communication and Learning Assessment Primary learner: Patient and patient's wife Michael Knox Preferred language: English Learning preferences: Listening Reading  All questions answered.  Michael Knox voiced understanding and appreciation.   Medication education handout placed in mail for patient. Patient knows to call the office with questions or concerns. Oral Chemotherapy Clinic phone number provided to patient.   Michael Knox, PharmD, BCPS, BCOP Hematology/Oncology Clinical Pharmacist [phone] 08/14/2024 9:59 AM

## 2024-08-14 NOTE — Progress Notes (Signed)
 Oral Chemotherapy Pharmacist Encounter  Patient was counseled under telephone encounter from 08/13/24.  Asberry Macintosh, PharmD, BCPS, BCOP Hematology/Oncology Clinical Pharmacist [phone] 08/14/2024 10:03 AM

## 2024-08-14 NOTE — Progress Notes (Signed)
 Specialty Pharmacy Initial Fill Coordination Note  Michael Knox is a 78 y.o. male contacted today regarding refills of specialty medication(s) Tivozanib HCl (FOTIVDA ) .  Patient requested Delivery  on 08/17/24  to verified address [address]  [place] [address]   Medication will be filled on 08/16/2024.   Patient is aware of $0.00 copayment.

## 2024-08-17 ENCOUNTER — Other Ambulatory Visit: Payer: Self-pay

## 2024-08-20 ENCOUNTER — Other Ambulatory Visit: Payer: Self-pay

## 2024-08-28 ENCOUNTER — Other Ambulatory Visit (HOSPITAL_COMMUNITY): Payer: Self-pay

## 2024-09-03 ENCOUNTER — Other Ambulatory Visit: Payer: Self-pay

## 2024-09-07 ENCOUNTER — Other Ambulatory Visit: Payer: Self-pay

## 2024-09-08 ENCOUNTER — Encounter (INDEPENDENT_AMBULATORY_CARE_PROVIDER_SITE_OTHER): Payer: Self-pay

## 2024-09-10 ENCOUNTER — Other Ambulatory Visit: Payer: Self-pay

## 2024-09-10 NOTE — Progress Notes (Signed)
 Specialty Pharmacy Ongoing Clinical Assessment Note  Michael Knox is a 79 y.o. male who is being followed by the specialty pharmacy service for RxSp Oncology   Patient's specialty medication(s) reviewed today: Tivozanib HCl (FOTIVDA )   Missed doses in the last 4 weeks: 0   Patient/Caregiver did not have any additional questions or concerns.   Therapeutic benefit summary: Unable to assess   Adverse events/side effects summary: No adverse events/side effects   Patient's therapy is appropriate to: Continue    Goals Addressed             This Visit's Progress    Slow Disease Progression       Patient is unable to be assessed as therapy was recently initiated. Patient will maintain adherence         Follow up: 3 months  [facility] Specialty Pharmacist

## 2024-09-10 NOTE — Progress Notes (Signed)
 Clinical Intervention Note  Clinical Intervention Notes: Patient's wife reported that he started hydralazine to help with blood pressure control. No DDIs identified with Fotivda    Clinical Intervention Outcomes: Prevention of an adverse drug event   Advertising Account Planner

## 2024-09-10 NOTE — Progress Notes (Signed)
 Specialty Pharmacy Refill Coordination Note  Michael Knox is a 79 y.o. male contacted today regarding refills of specialty medication(s) Tivozanib HCl (FOTIVDA )   Patient requested Delivery   Delivery date: 09/13/24   Verified address: [address] [place] [address]   Medication will be filled on: 09/12/24

## 2024-09-12 ENCOUNTER — Other Ambulatory Visit: Payer: Self-pay

## 2024-09-17 ENCOUNTER — Encounter: Payer: Self-pay | Admitting: Hematology & Oncology

## 2024-09-17 ENCOUNTER — Other Ambulatory Visit: Payer: Self-pay

## 2024-09-17 ENCOUNTER — Inpatient Hospital Stay: Payer: Self-pay | Attending: Hematology & Oncology

## 2024-09-17 ENCOUNTER — Inpatient Hospital Stay: Payer: Self-pay

## 2024-09-17 ENCOUNTER — Inpatient Hospital Stay: Payer: Self-pay | Admitting: Hematology & Oncology

## 2024-09-17 VITALS — BP 133/67 | HR 72 | Temp 97.8°F | Resp 18 | Ht 67.0 in | Wt 173.0 lb

## 2024-09-17 DIAGNOSIS — Z79899 Other long term (current) drug therapy: Secondary | ICD-10-CM | POA: Diagnosis not present

## 2024-09-17 DIAGNOSIS — C649 Malignant neoplasm of unspecified kidney, except renal pelvis: Secondary | ICD-10-CM

## 2024-09-17 DIAGNOSIS — C7801 Secondary malignant neoplasm of right lung: Secondary | ICD-10-CM | POA: Diagnosis present

## 2024-09-17 DIAGNOSIS — C641 Malignant neoplasm of right kidney, except renal pelvis: Secondary | ICD-10-CM | POA: Diagnosis present

## 2024-09-17 DIAGNOSIS — C786 Secondary malignant neoplasm of retroperitoneum and peritoneum: Secondary | ICD-10-CM | POA: Insufficient documentation

## 2024-09-17 LAB — CBC WITH DIFFERENTIAL (CANCER CENTER ONLY)
Abs Immature Granulocytes: 0.03 K/uL (ref 0.00–0.07)
Basophils Absolute: 0 K/uL (ref 0.0–0.1)
Basophils Relative: 1 %
Eosinophils Absolute: 0.3 K/uL (ref 0.0–0.5)
Eosinophils Relative: 6 %
HCT: 35.5 % — ABNORMAL LOW (ref 39.0–52.0)
Hemoglobin: 12 g/dL — ABNORMAL LOW (ref 13.0–17.0)
Immature Granulocytes: 1 %
Lymphocytes Relative: 14 %
Lymphs Abs: 0.6 K/uL — ABNORMAL LOW (ref 0.7–4.0)
MCH: 29.8 pg (ref 26.0–34.0)
MCHC: 33.8 g/dL (ref 30.0–36.0)
MCV: 88.1 fL (ref 80.0–100.0)
Monocytes Absolute: 0.4 K/uL (ref 0.1–1.0)
Monocytes Relative: 8 %
Neutro Abs: 3.1 K/uL (ref 1.7–7.7)
Neutrophils Relative %: 70 %
Platelet Count: 143 K/uL — ABNORMAL LOW (ref 150–400)
RBC: 4.03 MIL/uL — ABNORMAL LOW (ref 4.22–5.81)
RDW: 14.4 % (ref 11.5–15.5)
WBC Count: 4.5 K/uL (ref 4.0–10.5)
nRBC: 0 % (ref 0.0–0.2)

## 2024-09-17 LAB — CMP (CANCER CENTER ONLY)
ALT: 12 U/L (ref 0–44)
AST: 22 U/L (ref 15–41)
Albumin: 4 g/dL (ref 3.5–5.0)
Alkaline Phosphatase: 93 U/L (ref 38–126)
Anion gap: 10 (ref 5–15)
BUN: 26 mg/dL — ABNORMAL HIGH (ref 8–23)
CO2: 20 mmol/L — ABNORMAL LOW (ref 22–32)
Calcium: 8.5 mg/dL — ABNORMAL LOW (ref 8.9–10.3)
Chloride: 111 mmol/L (ref 98–111)
Creatinine: 1.95 mg/dL — ABNORMAL HIGH (ref 0.61–1.24)
GFR, Estimated: 34 mL/min — ABNORMAL LOW
Glucose, Bld: 111 mg/dL — ABNORMAL HIGH (ref 70–99)
Potassium: 3.8 mmol/L (ref 3.5–5.1)
Sodium: 142 mmol/L (ref 135–145)
Total Bilirubin: 0.8 mg/dL (ref 0.0–1.2)
Total Protein: 6.8 g/dL (ref 6.5–8.1)

## 2024-09-17 LAB — LACTATE DEHYDROGENASE: LDH: 271 U/L — ABNORMAL HIGH (ref 105–235)

## 2024-09-17 NOTE — Addendum Note (Signed)
 Addended by: Harumi Yamin on: 09/17/2024 12:46 PM   Modules accepted: Orders

## 2024-09-17 NOTE — Progress Notes (Signed)
 " Hematology and Oncology Follow Up Visit  Michael Knox [id number] June 24, 1946 79 y.o. 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  He is having a lot of problems getting the Fotivda .  Thankfully, his insurance company gave him a temporary supply.  They would not provide anymore.  They want to be on a different medication.  I am not sure what else they would want him on.  I will have to do some type of phone call with one of their specialists.  His pruritus is much better.  I am happy about that.  He has had no fever.  He has had no nausea or vomiting.  There is been no change in bowel or bladder habits.  He has had no leg swelling.  He has had no headache.  Overall, I would say that his performance status is probably ECOG 1.  Medications:  Current Outpatient Medications:    amLODipine (NORVASC) 10 MG tablet, Take 10 mg by mouth daily., Disp: , Rfl:    aspirin 81 MG EC tablet, Take 1 tablet by mouth daily., Disp: , Rfl:    Cholecalciferol 25 MCG (1000 UT) capsule, Take by mouth daily., Disp: , Rfl:    clotrimazole -betamethasone  (LOTRISONE ) cream, Apply 1 Application topically 2 (two) times daily., Disp: 30 g, Rfl: 4   famotidine  (PEPCID ) 20 MG tablet, Take by mouth daily., Disp: , Rfl:    lidocaine -prilocaine  (EMLA ) cream, Apply 1 Application topically as needed. Place on port one hour prior to port access and cover with plastic wrap, Disp: 30 g, Rfl: 0   mometasone   (ELOCON ) 0.1 % lotion, Apply topically daily., Disp: 60 mL, Rfl: 2   montelukast  (SINGULAIR ) 10 MG tablet, Take 1 tablet (10 mg total) by mouth at bedtime., Disp: 30 tablet, Rfl: 5   ondansetron  (ZOFRAN ) 8 MG tablet, Take 1 tablet (8 mg total) by mouth every 8 (eight) hours as needed for nausea or vomiting., Disp: 20 tablet, Rfl: 2   promethazine  (PHENERGAN ) 25 MG tablet, Take 1 tablet (25 mg total) by mouth every 6 (six) hours as needed for nausea or vomiting., Disp: 30 tablet, Rfl: 0   rosuvastatin (CRESTOR) 20 MG tablet, TAKE 1 TABLET BY MOUTH EVERY DAY FOR CHOLESTEROL, Disp: , Rfl:    telmisartan (MICARDIS) 80 MG tablet, TAKE ONE TABLET BY MOUTH EACH DAY FOR BLOOD PRESSURE, Disp: , Rfl:    tivozanib hcl (FOTIVDA ) 1.34 MG capsule, Take 1 capsule (1.34 mg total) by mouth daily. Take for 21 days on, then off for 7 days. Repeat every 28 days., Disp: 21 capsule, Rfl: 4 No current facility-administered medications for this visit.  Facility-Administered Medications Ordered in Other Visits:    sodium chloride  flush (NS) 0.9 % injection 10 mL, 10 mL, Intravenous, PRN, Lusine Corlett R, MD, 10 mL at 11/01/23 9095  Allergies:  Allergies  Allergen Reactions   Atorvastatin Other (See Comments)    Joint pain  Cabozantinib  Diarrhea, Nausea And Vomiting and Rash   Pitavastatin Other (See Comments)    Headache, constipation    Simvastatin Other (See Comments)    Joint pain      Past Medical History, Surgical history, Social history, and Family History were reviewed and updated.  Review of Systems: Review of Systems  Constitutional: Negative.   HENT:  Negative.    Eyes: Negative.   Respiratory: Negative.    Cardiovascular: Negative.   Gastrointestinal: Negative.   Endocrine: Negative.   Genitourinary: Negative.    Musculoskeletal: Negative.   Skin: Negative.   Neurological: Negative.   Hematological: Negative.   Psychiatric/Behavioral: Negative.      Physical Exam:  height is 5'  7 (1.702 m) and weight is 173 lb (78.5 kg). His oral temperature is 97.8 F (36.6 C). His blood pressure is 133/67 and his pulse is 72. His respiration is 18 and oxygen saturation is 99%.   Wt Readings from Last 3 Encounters:  09/17/24 173 lb (78.5 kg)  08/13/24 172 lb (78 kg)  06/25/24 171 lb (77.6 kg)    Physical Exam Vitals reviewed.  HENT:     Head: Normocephalic and atraumatic.  Eyes:     Pupils: Pupils are equal, round, and reactive to light.  Cardiovascular:     Rate and Rhythm: Normal rate and regular rhythm.     Heart sounds: Normal heart sounds.  Pulmonary:     Effort: Pulmonary effort is normal.     Breath sounds: Normal breath sounds.  Abdominal:     General: Bowel sounds are normal.     Palpations: Abdomen is soft.  Musculoskeletal:        General: No tenderness or deformity. Normal range of motion.     Cervical back: Normal range of motion.  Lymphadenopathy:     Cervical: No cervical adenopathy.  Skin:    General: Skin is warm and dry.     Findings: No erythema or rash.     Comments: On the anterior portion of his left shoulder, he has a large macular erythematous area.  It is quite scaly.  It is somewhat thickened.  On his body, he has a little papules.  The has excoriations.  He has had a lot of marks where he was itching it.  Neurological:     Mental Status: He is alert and oriented to person, place, and time.  Psychiatric:        Behavior: Behavior normal.        Thought Content: Thought content normal.        Judgment: Judgment normal.      Lab Results  Component Value Date   WBC 4.5 09/17/2024   HGB 12.0 (L) 09/17/2024   HCT 35.5 (L) 09/17/2024   MCV 88.1 09/17/2024   PLT 143 (L) 09/17/2024     Chemistry      Component Value Date/Time   NA 142 09/17/2024 0916   K 3.8 09/17/2024 0916   CL 111 09/17/2024 0916   CO2 20 (L) 09/17/2024 0916   BUN 26 (H) 09/17/2024 0916   CREATININE 1.95 (H) 09/17/2024 0916      Component Value Date/Time    CALCIUM 8.5 (L) 09/17/2024 0916   ALKPHOS 93 09/17/2024 0916   AST 22 09/17/2024 0916   ALT 12 09/17/2024 0916   BILITOT 0.8 09/17/2024 0916     Impression and Plan: Michael Knox is a very nice 79 year old white male.  Again, we are just having trouble  finding medicine that he can take for his kidney cancer.  Again, he is on Fotivda  right now.  However, this is only temporary supply.  Will have to call his insurance company and see what we can do did get him on this.  I think Fotivda  would be recommended for him.  Has been on other medications already.  I still think is way too early to do any CAT scan on him.  He has only had 1 cycle of the Fotivda .  I will plan to get him back in another month.  I would think that we probably would do a scan on him probably sometime in March or April.   1/19/202610:36 AM "

## 2024-09-17 NOTE — Patient Instructions (Signed)

## 2024-09-18 ENCOUNTER — Telehealth: Payer: Self-pay

## 2024-09-18 ENCOUNTER — Other Ambulatory Visit (HOSPITAL_COMMUNITY): Payer: Self-pay

## 2024-09-18 NOTE — Telephone Encounter (Signed)
 Oral Oncology Patient Advocate Encounter   Received notification that prior authorization for Fotivda  is required.   PA submitted on 09/18/24 Key [id number] Status is pending      Charlott Hamilton,  CPhT-Adv  she/her/hers [facility]  [facility] Specialty Pharmacy Services Pharmacy Technician Patient Advocate Specialist III [facility] Phone: [phone]  Fax: [phone] [email or web address]

## 2024-09-18 NOTE — Telephone Encounter (Signed)
 Oral Oncology Patient Advocate Encounter  Prior Authorization for Fotivda  has been approved.    PA# [id number] Effective dates: 09/18/24 through 09/18/25      Charlott Hamilton,  CPhT-Adv  she/her/hers [place]  [facility] Specialty Pharmacy Services Pharmacy Technician Patient Advocate Specialist III [facility] Phone: [phone]  Fax: [phone] [email or web address]

## 2024-10-03 ENCOUNTER — Other Ambulatory Visit (HOSPITAL_COMMUNITY): Payer: Self-pay

## 2024-10-04 ENCOUNTER — Other Ambulatory Visit: Payer: Self-pay

## 2024-10-04 NOTE — Progress Notes (Signed)
 Specialty Pharmacy Refill Coordination Note  Michael Knox is a 79 y.o. male contacted today regarding refills of specialty medication(s) Tivozanib HCl (FOTIVDA )   Patient requested Delivery   Delivery date: 10/09/24   Verified address: [address] [place] [address]   Medication will be filled on: 10/08/24

## 2024-10-22 ENCOUNTER — Inpatient Hospital Stay

## 2024-10-22 ENCOUNTER — Inpatient Hospital Stay: Admitting: Hematology & Oncology
